# Patient Record
Sex: Female | Born: 1990 | Race: Black or African American | Hispanic: No | Marital: Married | Smoking: Never smoker
Health system: Southern US, Community
[De-identification: ages and names within clinical notes are randomized; demographics above are authoritative.]

## PROBLEM LIST (undated history)

## (undated) ENCOUNTER — Inpatient Hospital Stay (HOSPITAL_COMMUNITY): Payer: Self-pay

## (undated) DIAGNOSIS — B999 Unspecified infectious disease: Secondary | ICD-10-CM

## (undated) DIAGNOSIS — S62109A Fracture of unspecified carpal bone, unspecified wrist, initial encounter for closed fracture: Secondary | ICD-10-CM

## (undated) HISTORY — PX: EYE SURGERY: SHX253

## (undated) HISTORY — DX: Fracture of unspecified carpal bone, unspecified wrist, initial encounter for closed fracture: S62.109A

## (undated) HISTORY — DX: Unspecified infectious disease: B99.9

---

## 2008-08-12 ENCOUNTER — Emergency Department (HOSPITAL_COMMUNITY): Admission: EM | Admit: 2008-08-12 | Discharge: 2008-08-12 | Payer: Self-pay | Admitting: Emergency Medicine

## 2009-03-18 ENCOUNTER — Emergency Department (HOSPITAL_COMMUNITY): Admission: EM | Admit: 2009-03-18 | Discharge: 2009-03-18 | Payer: Self-pay | Admitting: Emergency Medicine

## 2010-05-19 NOTE — L&D Delivery Note (Signed)
Delivery Note At 3:04 PM a viable female was delivered via Vaginal, Spontaneous Delivery (Presentation: Left Occiput Anterior).  APGAR: 9, 9; weight 7 lb 14.6 oz (3589 g).   Placenta status: Intact, Spontaneous.  Cord: 3 vessels with the following complications: None.   Anesthesia:1% lido for repair Episiotomy: none Lacerations: 1st deg perineal Suture Repair: 3.0 vicryl Est. Blood Loss (mL): 300cc  Mom to postpartum.  Baby to nursery-stable.  Cam Hai 04/29/2011, 4:11 PM

## 2010-08-29 LAB — STREP A DNA PROBE: Group A Strep Probe: NEGATIVE

## 2010-08-29 LAB — CBC
MCV: 78.5 fL (ref 78.0–100.0)
Platelets: 185 10*3/uL (ref 150–400)
WBC: 8.9 10*3/uL (ref 4.0–10.5)

## 2010-08-29 LAB — DIFFERENTIAL
Basophils Relative: 0 % (ref 0–1)
Eosinophils Absolute: 0.1 10*3/uL (ref 0.0–0.7)
Lymphs Abs: 1.1 10*3/uL (ref 0.7–4.0)
Neutrophils Relative %: 77 % (ref 43–77)

## 2010-08-29 LAB — MONONUCLEOSIS SCREEN: Mono Screen: NEGATIVE

## 2011-04-04 ENCOUNTER — Institutional Professional Consult (permissible substitution): Payer: Self-pay | Admitting: Pediatrics

## 2011-04-29 ENCOUNTER — Encounter (HOSPITAL_COMMUNITY): Payer: Self-pay | Admitting: *Deleted

## 2011-04-29 ENCOUNTER — Inpatient Hospital Stay (HOSPITAL_COMMUNITY)
Admission: AD | Admit: 2011-04-29 | Discharge: 2011-05-01 | DRG: 373 | Disposition: A | Discharge status: Home / Self Care | Payer: BC Managed Care – PPO | Source: Ambulatory Visit | Attending: Obstetrics & Gynecology | Admitting: Obstetrics & Gynecology

## 2011-04-29 LAB — RUBELLA ANTIBODY, IGM: Rubella: IMMUNE

## 2011-04-29 LAB — STREP B DNA PROBE: GBS: NEGATIVE

## 2011-04-29 LAB — CBC
HCT: 36.3 % (ref 36.0–46.0)
Hemoglobin: 12.1 g/dL (ref 12.0–15.0)
RDW: 14.3 % (ref 11.5–15.5)
WBC: 16.9 10*3/uL — ABNORMAL HIGH (ref 4.0–10.5)

## 2011-04-29 MED ORDER — IBUPROFEN 600 MG PO TABS
600.0000 mg | ORAL_TABLET | Freq: Four times a day (QID) | ORAL | Status: DC
  Administered 2011-04-29 – 2011-05-01 (×6): 600 mg via ORAL
  Filled 2011-04-29 (×8): qty 1

## 2011-04-29 MED ORDER — SIMETHICONE 80 MG PO CHEW: 80.0000 mg | CHEWABLE_TABLET | ORAL | Status: DC | PRN

## 2011-04-29 MED ORDER — LACTATED RINGERS IV SOLN
INTRAVENOUS | Status: DC
  Administered 2011-04-29: 125 mL/h via INTRAUTERINE

## 2011-04-29 MED ORDER — IBUPROFEN 600 MG PO TABS: 600.0000 mg | ORAL_TABLET | Freq: Four times a day (QID) | ORAL | Status: DC | PRN

## 2011-04-29 MED ORDER — ZOLPIDEM TARTRATE 5 MG PO TABS: 5.0000 mg | ORAL_TABLET | Freq: Every evening | ORAL | Status: DC | PRN

## 2011-04-29 MED ORDER — OXYTOCIN BOLUS FROM INFUSION
500.0000 mL | Freq: Once | INTRAVENOUS | Status: DC
  Filled 2011-04-29: qty 500

## 2011-04-29 MED ORDER — TERBUTALINE SULFATE 1 MG/ML IJ SOLN: 0.2500 mg | Freq: Once | INTRAMUSCULAR | Status: DC | PRN

## 2011-04-29 MED ORDER — BENZOCAINE-MENTHOL 20-0.5 % EX AERO
INHALATION_SPRAY | CUTANEOUS | Status: AC
  Filled 2011-04-29: qty 56

## 2011-04-29 MED ORDER — DIPHENHYDRAMINE HCL 25 MG PO CAPS: 25.0000 mg | ORAL_CAPSULE | Freq: Four times a day (QID) | ORAL | Status: DC | PRN

## 2011-04-29 MED ORDER — TETANUS-DIPHTH-ACELL PERTUSSIS 5-2.5-18.5 LF-MCG/0.5 IM SUSP: 0.5000 mL | Freq: Once | INTRAMUSCULAR | Status: DC

## 2011-04-29 MED ORDER — LANOLIN HYDROUS EX OINT: TOPICAL_OINTMENT | CUTANEOUS | Status: DC | PRN

## 2011-04-29 MED ORDER — FLEET ENEMA 7-19 GM/118ML RE ENEM: 1.0000 | ENEMA | RECTAL | Status: DC | PRN

## 2011-04-29 MED ORDER — OXYTOCIN 20 UNITS IN LACTATED RINGERS INFUSION - SIMPLE
125.0000 mL/h | Freq: Once | INTRAVENOUS | Status: AC
  Administered 2011-04-29: 125 mL/h via INTRAVENOUS

## 2011-04-29 MED ORDER — ONDANSETRON HCL 4 MG PO TABS: 4.0000 mg | ORAL_TABLET | ORAL | Status: DC | PRN

## 2011-04-29 MED ORDER — NALBUPHINE SYRINGE 5 MG/0.5 ML
10.0000 mg | INJECTION | INTRAMUSCULAR | Status: DC | PRN
  Administered 2011-04-29: 10 mg via INTRAVENOUS
  Filled 2011-04-29: qty 1

## 2011-04-29 MED ORDER — SENNOSIDES-DOCUSATE SODIUM 8.6-50 MG PO TABS
2.0000 | ORAL_TABLET | Freq: Every day | ORAL | Status: DC
  Administered 2011-04-29: 2 via ORAL
  Administered 2011-04-30: 1 via ORAL

## 2011-04-29 MED ORDER — DIBUCAINE 1 % RE OINT
1.0000 "application " | TOPICAL_OINTMENT | RECTAL | Status: DC | PRN
  Administered 2011-04-29: 1 via RECTAL
  Filled 2011-04-29: qty 28

## 2011-04-29 MED ORDER — OXYTOCIN 20 UNITS IN LACTATED RINGERS INFUSION - SIMPLE
1.0000 m[IU]/min | INTRAVENOUS | Status: DC
  Administered 2011-04-29: 1 m[IU]/min via INTRAVENOUS
  Filled 2011-04-29: qty 1000

## 2011-04-29 MED ORDER — LIDOCAINE HCL (PF) 1 % IJ SOLN
30.0000 mL | INTRAMUSCULAR | Status: DC | PRN
  Administered 2011-04-29: 30 mL via SUBCUTANEOUS
  Filled 2011-04-29: qty 30

## 2011-04-29 MED ORDER — CITRIC ACID-SODIUM CITRATE 334-500 MG/5ML PO SOLN: 30.0000 mL | ORAL | Status: DC | PRN

## 2011-04-29 MED ORDER — ONDANSETRON HCL 4 MG/2ML IJ SOLN: 4.0000 mg | Freq: Four times a day (QID) | INTRAMUSCULAR | Status: DC | PRN

## 2011-04-29 MED ORDER — WITCH HAZEL-GLYCERIN EX PADS: 1.0000 "application " | MEDICATED_PAD | CUTANEOUS | Status: DC | PRN

## 2011-04-29 MED ORDER — ACETAMINOPHEN 325 MG PO TABS: 650.0000 mg | ORAL_TABLET | ORAL | Status: DC | PRN

## 2011-04-29 MED ORDER — ONDANSETRON HCL 4 MG/2ML IJ SOLN: 4.0000 mg | INTRAMUSCULAR | Status: DC | PRN

## 2011-04-29 MED ORDER — LACTATED RINGERS IV SOLN
INTRAVENOUS | Status: DC
  Administered 2011-04-29: 125 mL/h via INTRAVENOUS

## 2011-04-29 MED ORDER — OXYCODONE-ACETAMINOPHEN 5-325 MG PO TABS: 1.0000 | ORAL_TABLET | ORAL | Status: DC | PRN

## 2011-04-29 MED ORDER — PRENATAL PLUS 27-1 MG PO TABS
1.0000 | ORAL_TABLET | Freq: Every day | ORAL | Status: DC
  Administered 2011-04-29 – 2011-05-01 (×3): 1 via ORAL
  Filled 2011-04-29 (×3): qty 1

## 2011-04-29 MED ORDER — LACTATED RINGERS IV SOLN: 500.0000 mL | INTRAVENOUS | Status: DC | PRN

## 2011-04-29 MED ORDER — BENZOCAINE-MENTHOL 20-0.5 % EX AERO: 1.0000 "application " | INHALATION_SPRAY | CUTANEOUS | Status: DC | PRN

## 2011-04-29 MED ORDER — OXYCODONE-ACETAMINOPHEN 5-325 MG PO TABS: 2.0000 | ORAL_TABLET | ORAL | Status: DC | PRN

## 2011-04-29 NOTE — Progress Notes (Signed)
Sabrina Sanchez is a 20 y.o. G1P0000 at [redacted]w[redacted]d Subjective: Feeling pressure and urge to push; requesting exam  Objective: BP 122/91  Pulse 119  Temp(Src) 98.5 F (36.9 C) (Oral)  Resp 20  SpO2 94%      FHT:  140, mod variability, some 10x10 accels, occ mi variables UC:   regular, every 3 minutes; MVUs adequate SVE:   Dilation: 10 Effacement (%): 100 Station: +2 Exam by:: Shamikia Linskey,CNM  Labs: Lab Results  Component Value Date   WBC 16.9* 04/29/2011   HGB 12.1 04/29/2011   HCT 36.3 04/29/2011   MCV 78.1 04/29/2011   PLT 181 04/29/2011    Assessment / Plan: Augmentation of labor, progressing well End 1st stage  Will begin pushing with the urge  Dagny Fiorentino 04/29/2011, 2:34 PM

## 2011-04-29 NOTE — H&P (Signed)
Sabrina Sanchez is a 20 y.o. female presenting for eval of protracted active phase of labor; has been cared for thus far prenatally and during labor by Clois Dupes CNM as pt was planning a home birth. Her prenatal records were reviewed in full. Care was shown to be initiated between 4-5 wks with certain LMP of 07/09/10 and regular cycles. Gestation was confirmed by 18 wk scan, GBS was neg, and rest of prenatal course was essentially unremarkable. She was in latent phase until 0650 this morning when her cx was found to be 6cm. AT 1030 she was 7cm and SROM with thick mec was noted and it was determined that she would transfer to [facility] for further eval of protracted active phase and MSF. History OB History    Grav Para Term Preterm Abortions TAB SAB Ect Mult Living   1 0 0 0 0 0 0 0 0 0      History reviewed. No pertinent past medical history. No past surgical history on file. Family History: family history is negative for Anesthesia problems. Social History:  reports that she has never smoked. She has never used smokeless tobacco. She reports that she does not drink alcohol or use illicit drugs.  Review of Systems  Constitutional: Negative for fever.  Gastrointestinal: Positive for diarrhea. Abdominal pain: used castor oil this AM.    Dilation: 7 Exam by:: Heidy Mccubbin,CNM Blood pressure 113/61, pulse 126, temperature 98.5 F (36.9 C), temperature source Oral, resp. rate 20, SpO2 100.00%. Maternal Exam:  Uterine Assessment: IUPC inserted upon arrival; MVUs approx 140 with ctx q 4 mins; mod MSF noted      Fetal Exam Fetal Monitor Review: Baseline rate: 140.  Variability: minimal (<5 bpm).   Pattern: variable decelerations.   Some 10x10 accels, but no true ones- some reactivity noted with exam; variables noted and at one time was to 80 but resolved with hands/knees, O2 and fluid bolus     Physical Exam  Constitutional: She is oriented to person, place, and time. She appears well-developed and  well-nourished.  HENT:  Head: Normocephalic.  Cardiovascular: Normal rate.   Respiratory: Effort normal.  Musculoskeletal: Normal range of motion.  Neurological: She is alert and oriented to person, place, and time.  Skin: Skin is warm and dry.  Psychiatric: She has a normal mood and affect. Her behavior is normal.    Prenatal labs: ABO, Rh: B/Positive/-- (12/11 0000) Antibody: Negative (12/11 0000) Rubella: Immune (12/11 0000) RPR:   NR HBsAg:   neg HIV: Non-reactive (12/11 0000)  GBS: Negative (12/11 0000)  Glucola: 97 Assessment/Plan: IUP at term Planning homebirth Protracted active labor MSF  Admit to [employer]- received report from her CNM Will monitor FHR closely- amnioinfusion started Nubain for pain Pitocin due to inadequate ctx Pt/spouse/CNM understand immediate plan of care    Cam Hai 04/29/2011, 12:24 PM

## 2011-04-29 NOTE — Progress Notes (Addendum)
Pt in wheelchair for transfer to room 104, pt passed out, pt back to bed, IV started to left forearm, LR bolus infusing, pt states is feeling "much better", bleeding- WNL, I/o cath done- 600 out. K.Clelia Croft, CNM notified

## 2011-04-30 LAB — RPR: RPR Ser Ql: NONREACTIVE

## 2011-04-30 NOTE — Progress Notes (Signed)
Post Partum Day #1 Subjective: no complaints, up ad lib and voiding; breastfeeding going slowly Objective: Blood pressure 100/63, pulse 100, temperature 98.5 F (36.9 C), temperature source Oral, resp. rate 18, height 5\' 7"  (1.702 m), weight 69.854 kg (154 lb), SpO2 99.00%, unknown if currently breastfeeding.  Physical Exam:  General: alert, cooperative and no distress Lochia: appropriate Uterine Fundus: firm   Basename 04/29/11 1230  HGB 12.1  HCT 36.3    Assessment/Plan: Plan for discharge tomorrow and Lactation consult   LOS: 1 day   Cam Hai 04/30/2011, 7:46 AM

## 2011-05-01 MED ORDER — BENZOCAINE-MENTHOL 20-0.5 % EX AERO
INHALATION_SPRAY | CUTANEOUS | Status: AC
  Filled 2011-05-01: qty 56

## 2011-05-01 MED ORDER — IBUPROFEN 600 MG PO TABS: 600.0000 mg | ORAL_TABLET | Freq: Four times a day (QID) | ORAL | Status: AC | PRN

## 2011-05-01 NOTE — Progress Notes (Signed)
Post Partum Day 2  no complaints  Objective: Blood pressure 103/66, pulse 86, temperature 98.3 F (36.8 C), temperature source Oral, resp. rate 18, height 5\' 7"  (1.702 m), weight 69.854 kg (154 lb), SpO2 97.00%, unknown if currently breastfeeding.  Physical Exam:  General: alert, cooperative and no distress Lochia: appropriate Uterine Fundus: firm Incision: n/a DVT Evaluation: No evidence of DVT seen on physical exam.   Basename 04/29/11 1230  HGB 12.1  HCT 36.3    Assessment/Plan: Discharge home, Breastfeeding and Contraception LAM    LOS: 2 days   Lukah Goswami E. 05/01/2011, 6:51 AM

## 2011-05-01 NOTE — Discharge Summary (Signed)
Obstetric Discharge Summary Reason for Admission: onset of labor, planned homebirth, presented at 7cm, protracted active phase, MSF Prenatal Procedures: NST and ultrasound Intrapartum Procedures: spontaneous vaginal delivery Postpartum Procedures: none Complications-Operative and Postpartum: 1st degree perineal laceration Hemoglobin  Date Value Range Status  04/29/2011 12.1  12.0-15.0 (g/dL) Final     HCT  Date Value Range Status  04/29/2011 36.3  36.0-46.0 (%) Final    Discharge Diagnoses: Term Pregnancy-delivered and Breastfeeding  Discharge Information: Date: 05/01/2011 Activity: pelvic rest Diet: routine Medications: Ibuprofen Condition: stable Instructions: refer to practice specific booklet Discharge to: home Follow-up Information    Please follow up. Sabrina Sanchez, CNM as scheduled)          Newborn Data: Live born female  Birth Weight: 7 lb 14.6 oz (3589 g) APGAR: 9, 9  Home with mother.  Sabrina Sanchez E. 05/01/2011, 6:53 AM

## 2011-05-02 ENCOUNTER — Ambulatory Visit (HOSPITAL_COMMUNITY)
Admission: RE | Admit: 2011-05-02 | Discharge: 2011-05-02 | Disposition: A | Discharge status: Home / Self Care | Payer: BC Managed Care – PPO | Source: Ambulatory Visit | Attending: Obstetrics & Gynecology | Admitting: Obstetrics & Gynecology

## 2011-05-02 NOTE — Progress Notes (Signed)
Adult Lactation Consultation Outpatient Visit Note  Patient Name: Sabrina Sanchez    BABY:  CHASITY Mariani Date of Birth: 30-Jun-1990            DOB:  04/29/2013 Gestational Age at Delivery: [redacted]w[redacted]d     BIRTH WEIGHT: 7-10 Type of Delivery: NVD                          WEIGHT TODAY:  7-2.6  Breastfeeding History: Frequency of Breastfeeding: ATTEMPTS EVERY 2-3 HOURS Length of Feeding: NONE TO 20 MIN Voids: 1/24 HRS Stools: 1/24 HRS  Supplementing / Method:FORMULA 50 MLS JUST PRIOR TO OUTPATIENT VISIT Pumping:  Type of Pump:DEBP   Frequency:PRN  Volume:  FEW MLS  Comments:    Consultation Evaluation:BABY IS 2 DAYS OLD AND MOTHER HERE WITH C/O DIFFICULT LATCH, CRACKED NIPPLES.  BABY WAS JUST FED 50 MLS OF FORMULA SO CONTENT AND SHOWING NO FEEDING CUES.  ASSISTED WITH CORRECT TECHNIQUES FOR POSITIONING AND LATCH BUT UNABLE TO DO A FEEDING ASSESSMENT. BOTH NIPPLES ARE CRACKED.   PATIENT GIVEN SORE NIPPLE SHELLS AND COMFORT GELS WITH INSTRUCTIONS.   BREASTS ARE BECOMING FULL AND ENGORGEMENT TREATMENT REVIEWED.  DEMONSTRATED USE OF 24 MM NIPPLE SHIELD FOR PRN USE.  BABY HAS WEIGHT CHECK WITH PEDI. FOLLOWING THIS APPOINTMENT TODAY.  INSTRUCTED PATIENT TO PUMP BOTH BREASTS EVERY 2-3 HOURS AND FEED WITH SLOW FLOW NIPPLE IF BABY NOT LATCHING.  ENCOURAGED TO CALL FOR FEEDING ASSESSMENT IF NO IMPROVEMENT.  Initial Feeding Assessment: Pre-feed Weight: Post-feed Weight: Amount Transferred: Comments:  Additional Feeding Assessment: Pre-feed Weight: Post-feed Weight: Amount Transferred: Comments:  Additional Feeding Assessment: Pre-feed Weight: Post-feed Weight: Amount Transferred: Comments:  Total Breast milk Transferred this Visit:  Total Supplement Given:   Additional Interventions:   Follow-Up WILL CALL PRN      Hansel Feinstein 05/02/2011, 3:37 PM

## 2011-08-06 ENCOUNTER — Ambulatory Visit (HOSPITAL_COMMUNITY): Admission: RE | Admit: 2011-08-06 | Payer: BC Managed Care – PPO | Source: Ambulatory Visit

## 2012-05-19 NOTE — L&D Delivery Note (Signed)
Delivery Note At 5:48 AM a viable female, "Sabrina Sanchez", was delivered in waterbirth tub via Vaginal, Spontaneous Delivery (Presentation: ;  ).  APGAR: 9, 9; weight 7 lb 13.6 oz (3561 g).   Placenta status: Intact, Spontaneous.  Cord: 3 vessels with the following complications: Cord around shoulders x 1.  Cord pH: NA  Anesthesia: Local  Episiotomy: None Lacerations: 2nd degree Suture Repair: 3.0 monocryl Est. Blood Loss (mL): 400 cc  Mom to postpartum.  Baby to skin to skin.   Nigel Bridgeman 03/02/2013, 7:18 AM

## 2012-07-02 LAB — OB RESULTS CONSOLE GC/CHLAMYDIA
Chlamydia: NEGATIVE
Gonorrhea: NEGATIVE

## 2012-07-09 ENCOUNTER — Ambulatory Visit: Payer: BC Managed Care – PPO | Admitting: Obstetrics and Gynecology

## 2012-07-09 ENCOUNTER — Encounter: Payer: Self-pay | Admitting: Obstetrics and Gynecology

## 2012-07-09 DIAGNOSIS — O26849 Uterine size-date discrepancy, unspecified trimester: Secondary | ICD-10-CM

## 2012-07-09 DIAGNOSIS — Z331 Pregnant state, incidental: Secondary | ICD-10-CM

## 2012-07-09 LAB — POCT URINALYSIS DIPSTICK
Bilirubin, UA: NEGATIVE
Ketones, UA: NEGATIVE
Leukocytes, UA: NEGATIVE
pH, UA: 7

## 2012-07-09 NOTE — Progress Notes (Signed)
NOB INTERVIEW. Pt states LMP did not seem normal. Dating U/S scheduled per protocol. NOB W/U with VL 08/06/12.

## 2012-07-10 LAB — CULTURE, OB URINE: Colony Count: 15000

## 2012-07-12 ENCOUNTER — Telehealth: Payer: Self-pay | Admitting: Obstetrics and Gynecology

## 2012-07-12 LAB — PRENATAL PANEL VII
Basophils Absolute: 0 10*3/uL (ref 0.0–0.1)
Basophils Relative: 0 % (ref 0–1)
HIV: NONREACTIVE
Hepatitis B Surface Ag: NEGATIVE
MCHC: 32.9 g/dL (ref 30.0–36.0)
Neutro Abs: 4.9 10*3/uL (ref 1.7–7.7)
Neutrophils Relative %: 69 % (ref 43–77)
RDW: 15.8 % — ABNORMAL HIGH (ref 11.5–15.5)

## 2012-07-12 NOTE — Telephone Encounter (Signed)
Encounter complete. 

## 2012-07-13 LAB — HEMOGLOBINOPATHY EVALUATION
Hemoglobin Other: 0 %
Hgb A2 Quant: 2.7 % (ref 2.2–3.2)
Hgb F Quant: 0 % (ref 0.0–2.0)

## 2012-07-21 ENCOUNTER — Ambulatory Visit: Payer: BC Managed Care – PPO

## 2012-07-21 ENCOUNTER — Ambulatory Visit: Payer: BC Managed Care – PPO | Admitting: Certified Nurse Midwife

## 2012-07-21 ENCOUNTER — Other Ambulatory Visit: Payer: Self-pay

## 2012-07-21 ENCOUNTER — Encounter: Payer: Self-pay | Admitting: Certified Nurse Midwife

## 2012-07-21 VITALS — BP 100/58 | Wt 158.0 lb

## 2012-07-21 DIAGNOSIS — O26849 Uterine size-date discrepancy, unspecified trimester: Secondary | ICD-10-CM

## 2012-07-21 DIAGNOSIS — Z331 Pregnant state, incidental: Secondary | ICD-10-CM

## 2012-07-21 LAB — US OB COMP LESS 14 WKS

## 2012-07-21 NOTE — Progress Notes (Signed)
[redacted]w[redacted]d Go over U/S

## 2012-08-06 ENCOUNTER — Encounter: Payer: BC Managed Care – PPO | Admitting: Obstetrics and Gynecology

## 2012-08-06 ENCOUNTER — Other Ambulatory Visit: Payer: Self-pay | Admitting: Obstetrics and Gynecology

## 2012-08-07 LAB — CULTURE, OB URINE

## 2012-08-08 NOTE — Progress Notes (Signed)
Doing well per pt. No vertigo FHT's regular US-viable fetus EDD 03-05-2013 Needs OB WU Blood type B pos Re check CBC next visit Increase fluids and balanced diet

## 2012-08-09 LAB — PAP IG W/ RFLX HPV ASCU

## 2013-03-02 ENCOUNTER — Inpatient Hospital Stay (HOSPITAL_COMMUNITY)
Admission: AD | Admit: 2013-03-02 | Discharge: 2013-03-03 | DRG: 775 | Disposition: A | Discharge status: Home / Self Care | Payer: Medicaid Other | Source: Ambulatory Visit | Attending: Obstetrics and Gynecology | Admitting: Obstetrics and Gynecology

## 2013-03-02 ENCOUNTER — Encounter (HOSPITAL_COMMUNITY): Payer: Self-pay | Admitting: *Deleted

## 2013-03-02 DIAGNOSIS — O9902 Anemia complicating childbirth: Secondary | ICD-10-CM | POA: Diagnosis present

## 2013-03-02 DIAGNOSIS — D649 Anemia, unspecified: Secondary | ICD-10-CM | POA: Diagnosis present

## 2013-03-02 DIAGNOSIS — Z22330 Carrier of Group B streptococcus: Secondary | ICD-10-CM

## 2013-03-02 DIAGNOSIS — O99892 Other specified diseases and conditions complicating childbirth: Secondary | ICD-10-CM | POA: Diagnosis present

## 2013-03-02 LAB — CBC
HCT: 36.3 % (ref 36.0–46.0)
MCV: 74.8 fL — ABNORMAL LOW (ref 78.0–100.0)
Platelets: 176 10*3/uL (ref 150–400)
RBC: 4.85 MIL/uL (ref 3.87–5.11)
WBC: 11.6 10*3/uL — ABNORMAL HIGH (ref 4.0–10.5)

## 2013-03-02 LAB — RPR: RPR Ser Ql: NONREACTIVE

## 2013-03-02 MED ORDER — OXYCODONE-ACETAMINOPHEN 5-325 MG PO TABS: 1.0000 | ORAL_TABLET | ORAL | Status: DC | PRN

## 2013-03-02 MED ORDER — SODIUM CHLORIDE 0.9 % IV SOLN: 250.0000 mL | INTRAVENOUS | Status: DC | PRN

## 2013-03-02 MED ORDER — TETANUS-DIPHTH-ACELL PERTUSSIS 5-2.5-18.5 LF-MCG/0.5 IM SUSP: 0.5000 mL | Freq: Once | INTRAMUSCULAR | Status: DC

## 2013-03-02 MED ORDER — LACTATED RINGERS IV SOLN: 500.0000 mL | INTRAVENOUS | Status: DC | PRN

## 2013-03-02 MED ORDER — SIMETHICONE 80 MG PO CHEW: 80.0000 mg | CHEWABLE_TABLET | ORAL | Status: DC | PRN

## 2013-03-02 MED ORDER — SODIUM CHLORIDE 0.9 % IV SOLN
2.0000 g | Freq: Once | INTRAVENOUS | Status: AC
  Administered 2013-03-02: 2 g via INTRAVENOUS
  Filled 2013-03-02: qty 2000

## 2013-03-02 MED ORDER — ONDANSETRON HCL 4 MG/2ML IJ SOLN: 4.0000 mg | INTRAMUSCULAR | Status: DC | PRN

## 2013-03-02 MED ORDER — FLEET ENEMA 7-19 GM/118ML RE ENEM: 1.0000 | ENEMA | RECTAL | Status: DC | PRN

## 2013-03-02 MED ORDER — SODIUM CHLORIDE 0.9 % IJ SOLN: 3.0000 mL | INTRAMUSCULAR | Status: DC | PRN

## 2013-03-02 MED ORDER — PRENATAL MULTIVITAMIN CH
1.0000 | ORAL_TABLET | Freq: Every day | ORAL | Status: DC
  Administered 2013-03-02 – 2013-03-03 (×2): 1 via ORAL
  Filled 2013-03-02 (×2): qty 1

## 2013-03-02 MED ORDER — WITCH HAZEL-GLYCERIN EX PADS: 1.0000 "application " | MEDICATED_PAD | CUTANEOUS | Status: DC | PRN

## 2013-03-02 MED ORDER — LANOLIN HYDROUS EX OINT: TOPICAL_OINTMENT | CUTANEOUS | Status: DC | PRN

## 2013-03-02 MED ORDER — ONDANSETRON HCL 4 MG/2ML IJ SOLN: 4.0000 mg | Freq: Four times a day (QID) | INTRAMUSCULAR | Status: DC | PRN

## 2013-03-02 MED ORDER — LIDOCAINE HCL (PF) 1 % IJ SOLN
30.0000 mL | INTRAMUSCULAR | Status: DC | PRN
  Filled 2013-03-02: qty 30

## 2013-03-02 MED ORDER — DIPHENHYDRAMINE HCL 25 MG PO CAPS: 25.0000 mg | ORAL_CAPSULE | Freq: Four times a day (QID) | ORAL | Status: DC | PRN

## 2013-03-02 MED ORDER — SODIUM CHLORIDE 0.9 % IJ SOLN: 3.0000 mL | Freq: Two times a day (BID) | INTRAMUSCULAR | Status: DC

## 2013-03-02 MED ORDER — CITRIC ACID-SODIUM CITRATE 334-500 MG/5ML PO SOLN: 30.0000 mL | ORAL | Status: DC | PRN

## 2013-03-02 MED ORDER — ZOLPIDEM TARTRATE 5 MG PO TABS: 5.0000 mg | ORAL_TABLET | Freq: Every evening | ORAL | Status: DC | PRN

## 2013-03-02 MED ORDER — LACTATED RINGERS IV SOLN
INTRAVENOUS | Status: DC
  Administered 2013-03-02: 02:00:00 via INTRAVENOUS

## 2013-03-02 MED ORDER — ONDANSETRON HCL 4 MG PO TABS: 4.0000 mg | ORAL_TABLET | ORAL | Status: DC | PRN

## 2013-03-02 MED ORDER — ACETAMINOPHEN 325 MG PO TABS: 650.0000 mg | ORAL_TABLET | ORAL | Status: DC | PRN

## 2013-03-02 MED ORDER — BENZOCAINE-MENTHOL 20-0.5 % EX AERO
1.0000 "application " | INHALATION_SPRAY | CUTANEOUS | Status: DC | PRN
  Administered 2013-03-02: 1 via TOPICAL
  Filled 2013-03-02 (×3): qty 56

## 2013-03-02 MED ORDER — IBUPROFEN 600 MG PO TABS
600.0000 mg | ORAL_TABLET | Freq: Four times a day (QID) | ORAL | Status: DC
  Administered 2013-03-02 – 2013-03-03 (×5): 600 mg via ORAL
  Filled 2013-03-02 (×5): qty 1

## 2013-03-02 MED ORDER — DIBUCAINE 1 % RE OINT
1.0000 "application " | TOPICAL_OINTMENT | RECTAL | Status: DC | PRN
  Filled 2013-03-02: qty 28

## 2013-03-02 MED ORDER — BUTORPHANOL TARTRATE 1 MG/ML IJ SOLN: 1.0000 mg | INTRAMUSCULAR | Status: DC | PRN

## 2013-03-02 MED ORDER — SENNOSIDES-DOCUSATE SODIUM 8.6-50 MG PO TABS
2.0000 | ORAL_TABLET | ORAL | Status: DC
  Administered 2013-03-02: 2 via ORAL
  Filled 2013-03-02: qty 2

## 2013-03-02 MED ORDER — IBUPROFEN 600 MG PO TABS
600.0000 mg | ORAL_TABLET | Freq: Four times a day (QID) | ORAL | Status: DC | PRN
  Administered 2013-03-02: 600 mg via ORAL
  Filled 2013-03-02: qty 1

## 2013-03-02 MED ORDER — OXYTOCIN BOLUS FROM INFUSION: 500.0000 mL | INTRAVENOUS | Status: DC

## 2013-03-02 MED ORDER — OXYTOCIN 40 UNITS IN LACTATED RINGERS INFUSION - SIMPLE MED
62.5000 mL/h | INTRAVENOUS | Status: DC
  Administered 2013-03-02: 62.5 mL/h via INTRAVENOUS
  Filled 2013-03-02: qty 1000

## 2013-03-02 NOTE — MAU Note (Signed)
PT SAYS SHE THINKS SROM AT 1140PM-  YELLOW-.     HURT BAD AT    UNSURE.   NO VE  IN OFFICE- WAS SUPPOSE  TO GO TO OFFICE YESTERDAY- BUT SHE CANCELLED.    GBS- POSITIVE- PER PT.    DENIES HSV AND MRSA.

## 2013-03-02 NOTE — Progress Notes (Signed)
Manfred Arch, CNM in room per RN request secondary pt bearing down & pushing with ctxs

## 2013-03-02 NOTE — H&P (Signed)
  Sabrina Sanchez is a 22 y.o. female, G2P1001 at 59 4/7 weeks, presenting with SROM at 11:45pm, with light MSF noted, and UCs q 3-4 min.  Reports +FM, denies bleeding.  Plans waterbirth, has doula Shanda Bumps Pace)--she is coming to hospital.  Problem List: GBS positive Insufficient glandular tissue--difficulty with lactation pp after last delivery. Mild anemia  History of present pregnancy: Patient entered care at 10 3/7 weeks.   EDC of 03/06/13 was established by LMP and 1st trimester [place].   Anatomy scan:  21 6/7 weeks, with normal findings and an posterior placenta.   Limited cardiac anatomy.  Additional [place] evaluations:  24 6/7 weeks, to f/u cardiac anatomy--all WNL Significant prenatal events:  Progesterone level checked during pregnancy, declined supplementation (initially had desired to supplement due to hx of insufficient glandular tissue) Last evaluation:  02/21/13, no VE done.  OB History   Grav Para Term Preterm Abortions TAB SAB Ect Mult Living   2 1 1  0 0 0 0 0 0 1    2012--41 6/7 weeks, attempted home birth, long labor--transported to [facility], started pit aug, delivered with Chiropractor.  7+14, no complications.  Past Medical History  Diagnosis Date  . Infection     UTI X 1  . Infection     OCC YEAST  . Fracture of wrist AGE 37 AND AGE 57   Past Surgical History  Procedure Laterality Date  . Eye surgery  AGE 6   Family History: family history includes Asthma in her maternal aunt; Hyperlipidemia in her maternal grandmother; Thrombophlebitis in her maternal grandmother. There is no history of Anesthesia problems.  Social History:  reports that she has never smoked. She has never used smokeless tobacco. She reports that she does not drink alcohol or use illicit drugs.  Husband, Artis Buechele, is involved and supportive.  Patient currently unemployed.   Prenatal Transfer Tool  Maternal Diabetes: No Genetic Screening: Normal 1st trimester screen Maternal Ultrasounds/Referrals:  Normal Fetal Ultrasounds or other Referrals:  None Maternal Substance Abuse:  No Significant Maternal Medications:  None Significant Maternal Lab Results: Lab values include: Group B Strep positive Planning waterbirth.    ROS:  Contractions, leaking yellow fluid, +FM.  No Known Allergies     Blood pressure 130/86, pulse 98, temperature 98.3 F (36.8 C), temperature source Oral, resp. rate 20, height 5\' 6"  (1.676 m), weight 197 lb 6 oz (89.529 kg), last menstrual period 05/29/2012.  Chest clear Heart RRR without murmur Abd gravid, NT, FH 39 cm Pelvic: 5-6, 80%, vtx, -1, leaking light MSF Ext: WNL  FHR: Category 1 on initial tracing UCs:  q 3-4 min, moderate.  Prenatal labs: ABO, Rh: B/POS/-- (02/21 0941) Antibody: NEG (02/21 0941) Rubella:   Immune RPR: NON REAC (02/21 0941)  HBsAg: NEGATIVE (02/21 0941)  HIV: NON REACTIVE (02/21 0941)  GBS:  Positive Sickle cell/Hgb electrophoresis: Negative Pap:  07/2012 GC:  Negative at NOB visit Chlamydia:  Negative at NOB visit Genetic screenings: Normal 1st trimester screen Glucola:  Normal       Assessment/Plan: IUP at 39 4/7 weeks Active labor GBS positive Plans waterbirth  Plan: Admit to Birthing Suite per consult with Dr. Su Hilt Routine [facility] orders Ampicillin for GBS prophylaxis, due to active labor OK for waterbirth--consent on chart. Intermittent monitoring.  Malyssa Maris, VICKICNM, [place] 03/02/2013, 1:39 AM

## 2013-03-03 LAB — CBC
HCT: 25 % — ABNORMAL LOW (ref 36.0–46.0)
MCV: 76.5 fL — ABNORMAL LOW (ref 78.0–100.0)
Platelets: 130 10*3/uL — ABNORMAL LOW (ref 150–400)
RBC: 3.27 MIL/uL — ABNORMAL LOW (ref 3.87–5.11)
RDW: 15.8 % — ABNORMAL HIGH (ref 11.5–15.5)
WBC: 9.9 10*3/uL (ref 4.0–10.5)

## 2013-03-03 MED ORDER — OXYCODONE-ACETAMINOPHEN 5-325 MG PO TABS: 1.0000 | ORAL_TABLET | Freq: Four times a day (QID) | ORAL | Status: DC | PRN

## 2013-03-03 MED ORDER — IBUPROFEN 600 MG PO TABS: 600.0000 mg | ORAL_TABLET | Freq: Four times a day (QID) | ORAL | Status: DC | PRN

## 2013-03-03 NOTE — Discharge Summary (Signed)
Obstetric Discharge Summary Reason for Admission: onset of labor Prenatal Procedures: ultrasound Intrapartum Procedures: spontaneous vaginal delivery Postpartum Procedures: none Complications-Operative and Postpartum: none Hemoglobin  Date Value Range Status  03/03/2013 8.1* 12.0 - 15.0 g/dL Final     DELTA CHECK NOTED     REPEATED TO VERIFY     HCT  Date Value Range Status  03/03/2013 25.0* 36.0 - 46.0 % Final   Pt felt a little dizzy yesterday but no complaints today but has only ambulated to bathroom.  I rec patient walk up and down halls and if is asymptomatic then she may be discharged home.  The RN will let me know if she has any symptoms.  Breast feeding.  Undecided about BC.  Physical Exam:  General: alert and no distress Lochia: appropriate Uterine Fundus: firm DVT Evaluation: No evidence of DVT seen on physical exam.  Discharge Diagnoses: Term Pregnancy-delivered  Discharge Information: Date: 03/03/2013 Activity: pelvic rest Diet: routine Medications: Ibuprofen and Percocet Condition: stable Instructions: refer to practice specific booklet Discharge to: home Follow-up Information   Follow up with [facility] In 6 weeks. (for post partum visit)    Specialty:  Obstetrics and Gynecology   Contact information:   [address] [place] [address] [phone]      Newborn Data: Live born female  Birth Weight: 7 lb 13.6 oz (3561 g) APGAR: 9, 9  Home with mother.  Annah Jasko Y 03/03/2013, 1:24 PM

## 2013-03-24 ENCOUNTER — Other Ambulatory Visit: Payer: Self-pay

## 2014-03-20 ENCOUNTER — Encounter (HOSPITAL_COMMUNITY): Payer: Self-pay | Admitting: *Deleted

## 2014-05-01 LAB — OB RESULTS CONSOLE ABO/RH: RH TYPE: POSITIVE

## 2014-05-01 LAB — OB RESULTS CONSOLE HEPATITIS B SURFACE ANTIGEN: HEP B S AG: NEGATIVE

## 2014-05-01 LAB — OB RESULTS CONSOLE HIV ANTIBODY (ROUTINE TESTING): HIV: NONREACTIVE

## 2014-05-01 LAB — OB RESULTS CONSOLE RUBELLA ANTIBODY, IGM: RUBELLA: IMMUNE

## 2014-05-01 LAB — OB RESULTS CONSOLE RPR: RPR: NONREACTIVE

## 2014-05-19 NOTE — L&D Delivery Note (Signed)
Delivery Note At 6:54 AM a viable and healthy female "as yet unnamed" was delivered via [place] (Presentation:OA). APGARS: 7, 9; weight 8 lbs 4.5 oz (3756g).    Placenta status: Intact, Spontaneous, marginal insertion.  Cord: 3 vessels with the following complications: Tight nuchal x 1, infant somersaulted through. Cord pH: NA  Anesthesia: Local for repair Episiotomy: None Lacerations: 2nd degree perineal   Suture Repair: 3.0 vicryl CT-1  Repair by Venus Standard, CNM Est. Blood Loss (mL): 585   10 units Pitocin given IM after delivery of placenta.  Mom to postpartum.  Baby to Couplet care / Skin to Skin.  Mom plans to breastfeed.  Desires outpatient circumcision.  Birth control not discussed.  Sherre Scarlet 11/12/2014, 8:00 AM

## 2014-06-01 LAB — OB RESULTS CONSOLE GC/CHLAMYDIA
CHLAMYDIA, DNA PROBE: NEGATIVE
Gonorrhea: NEGATIVE

## 2014-10-24 LAB — OB RESULTS CONSOLE GBS: STREP GROUP B AG: NEGATIVE

## 2014-11-11 ENCOUNTER — Inpatient Hospital Stay (HOSPITAL_COMMUNITY)
Admission: AD | Admit: 2014-11-11 | Discharge: 2014-11-13 | DRG: 775 | Disposition: A | Discharge status: Home / Self Care | Payer: BLUE CROSS/BLUE SHIELD | Source: Ambulatory Visit | Attending: Obstetrics and Gynecology | Admitting: Obstetrics and Gynecology

## 2014-11-11 ENCOUNTER — Encounter (HOSPITAL_COMMUNITY): Payer: Self-pay | Admitting: *Deleted

## 2014-11-11 ENCOUNTER — Inpatient Hospital Stay (HOSPITAL_COMMUNITY)
Admission: AD | Admit: 2014-11-11 | Discharge: 2014-11-11 | Disposition: A | Discharge status: Home / Self Care | Payer: BLUE CROSS/BLUE SHIELD | Source: Ambulatory Visit | Attending: Obstetrics and Gynecology | Admitting: Obstetrics and Gynecology

## 2014-11-11 ENCOUNTER — Encounter (HOSPITAL_COMMUNITY): Payer: Self-pay

## 2014-11-11 DIAGNOSIS — O9902 Anemia complicating childbirth: Secondary | ICD-10-CM | POA: Diagnosis present

## 2014-11-11 DIAGNOSIS — O43123 Velamentous insertion of umbilical cord, third trimester: Secondary | ICD-10-CM | POA: Diagnosis present

## 2014-11-11 DIAGNOSIS — Z6830 Body mass index (BMI) 30.0-30.9, adult: Secondary | ICD-10-CM

## 2014-11-11 DIAGNOSIS — Z3A39 39 weeks gestation of pregnancy: Secondary | ICD-10-CM

## 2014-11-11 DIAGNOSIS — IMO0002 Reserved for concepts with insufficient information to code with codable children: Secondary | ICD-10-CM

## 2014-11-11 MED ORDER — ACETAMINOPHEN 325 MG PO TABS: 650.0000 mg | ORAL_TABLET | ORAL | Status: DC | PRN

## 2014-11-11 MED ORDER — ONDANSETRON HCL 4 MG/2ML IJ SOLN: 4.0000 mg | Freq: Four times a day (QID) | INTRAMUSCULAR | Status: DC | PRN

## 2014-11-11 MED ORDER — LACTATED RINGERS IV SOLN: 500.0000 mL | INTRAVENOUS | Status: DC | PRN

## 2014-11-11 MED ORDER — OXYCODONE-ACETAMINOPHEN 5-325 MG PO TABS: 1.0000 | ORAL_TABLET | ORAL | Status: DC | PRN

## 2014-11-11 MED ORDER — OXYTOCIN 40 UNITS IN LACTATED RINGERS INFUSION - SIMPLE MED: 62.5000 mL/h | INTRAVENOUS | Status: DC

## 2014-11-11 MED ORDER — NALBUPHINE HCL 10 MG/ML IJ SOLN: 10.0000 mg | INTRAMUSCULAR | Status: DC | PRN

## 2014-11-11 MED ORDER — FLEET ENEMA 7-19 GM/118ML RE ENEM: 1.0000 | ENEMA | RECTAL | Status: DC | PRN

## 2014-11-11 MED ORDER — LIDOCAINE HCL (PF) 1 % IJ SOLN
30.0000 mL | INTRAMUSCULAR | Status: DC | PRN
  Administered 2014-11-12: 30 mL via SUBCUTANEOUS
  Filled 2014-11-11: qty 30

## 2014-11-11 MED ORDER — OXYTOCIN BOLUS FROM INFUSION: 500.0000 mL | INTRAVENOUS | Status: DC

## 2014-11-11 MED ORDER — SODIUM CHLORIDE 0.9 % IV SOLN: 250.0000 mL | INTRAVENOUS | Status: DC | PRN

## 2014-11-11 MED ORDER — OXYCODONE-ACETAMINOPHEN 5-325 MG PO TABS: 2.0000 | ORAL_TABLET | ORAL | Status: DC | PRN

## 2014-11-11 MED ORDER — CITRIC ACID-SODIUM CITRATE 334-500 MG/5ML PO SOLN: 30.0000 mL | ORAL | Status: DC | PRN

## 2014-11-11 NOTE — H&P (Signed)
Sabrina Sanchez is a 24 y.o. female, G3P2002 at 39.6 weeks, presenting for regular contractions since earlier this evening. +FM. Denies leaking or bleeding.  Desires Waterbirth; birthplan in hand. Had successful Waterbirth in 2014. Family & doula Sabrina Sanchez) at bedside.  Refuses admission labs and saline lock.  Patient Active Problem List   Diagnosis Date Noted  . BMI 30.0-30.9,adult 11/12/2014  . Marginal insertion of umbilical cord 11/12/2014  . Normal labor 11/11/2014    History of present pregnancy: Patient entered care with [facility] at 31 weeks, transferred in from [facility] due to preferences for providers at [facility].  EDC of 11/12/14 was established by sure LMP and that was c/w u/s at 11.1 wks.   Anatomy scan: No record.   Additional [place] evaluations:  30.1 wks f/u u/s for marginal cord insertion: EFW 3+8 (82nd%tile). Posterior placenta. 33.3 wks for growth and MI of cord: EFW 4+15 (48th%tile, 2254 g). AFI 14.1 cm (50th%tile -- normal fluid). Posterior fundal placenta. Stable marginal placental cord insertion. Cvx 3.99 cm -- closed. Significant prenatal events w/o significant findings -- pruritic rash to right leg, pelvic pain and LE swelling.  Marginal cord insertion noted on ? Anatomy scan. Began having ctxs around 38 wks. No MAU visits. TWG 13 lbs. Hoping to avoid OP presentation -- going to Chiropractor.   Last evaluation: Office 11/10/14 @ 39.5 wks by [facility], NP. FHR 140 bpm. BP 100/60. Wt 195 lbs.  OB History    Gravida Para Term Preterm AB TAB SAB Ectopic Multiple Living   0 0 0 0 0 0 2    SVD on 04/29/2011 @ 42.0 wks, female infant, "Sabrina Sanchez", birthwt 7+14. Attempted home birth with Clois Dupes, CNM--35-hour labor, with OP baby.  Had MSF as labor continued--transported to [facility], received Pitocin. Delivered with [facility]. Had 15 min pushing stage. Used tub at home.    SVD (Waterbirth) on 03/02/2013 @ 39.4 wks, 32-hr labor, female infant,  "Sabrina Sanchez", birthwt 7+13.6 (3561 g), WHG -- delivered by Nigel Bridgeman, CNM.   Past Medical History  Diagnosis Date  . Infection     UTI X 1  . Infection     OCC YEAST  . Fracture of wrist AGE 50 AND AGE 89   Past Surgical History  Procedure Laterality Date  . Eye surgery  AGE 49   Family History: family history includes Asthma in her maternal aunt; Hyperlipidemia in her maternal grandmother; Thrombophlebitis in her maternal grandmother. There is no history of Anesthesia problems. Social History:  reports that she has never smoked. She has never used smokeless tobacco. She reports that she does not drink alcohol or use illicit drugs. 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 She is of the [place] faith and will accept blood in an emergency.  Prenatal Transfer Tool  Maternal Diabetes: No Genetic Screening: Declined Maternal Ultrasounds/Referrals: Normal Fetal Ultrasounds or other Referrals:  None Maternal Substance Abuse:  No Significant Maternal Medications:  Meds include: Other: PNVs, Vitamins C & D, Iron Significant Maternal Lab Results: Lab values include: Group B Strep negative  TDAP: NA Flu: NA  ROS: 10+ point review of systems negative except as detailed in HPI.    No Known Allergies   Dilation: 5 Effacement (%): 80 Station: -3 Exam by:: K. Vayla Wilhelmi CNM Blood pressure 124/59, pulse 102, temperature 97.5 F (36.4 C), temperature source Oral, resp.  rate 18, height 5\' 7"  (1.702 m), weight 88.451 kg (195 lb), last menstrual period 02/06/2014, unknown if currently breastfeeding.  Chest clear Heart RRR without murmur Abd gravid, NT, FH CWD Pelvic: As above. BBOW -- ballotable Ext: WNL Cephalic by Leopolds and VE Bishop score: 10 EFW: 7+12   FHR: Category 1 UCs:  Irregular  Prenatal labs: ABO, Rh: B/Positive/-- (12/14 0000) Antibody:   Rubella:   Immune RPR:  Nonreactive (12/14 0000)  HBsAg: Negative (12/14 0000)  HIV: Non-reactive (12/14 0000)  GBS: Negative (06/07 0000) Sickle cell/Hgb electrophoresis: Normal study Pap: Normal on 05/01/14 GC: Neg on 06/01/14 Chlamydia: Neg on 06/01/14 Genetic screenings: Declined Glucola: Normal at 103  Hgb 12.6 at NOB, 11.4 at 28 weeks  Assessment: IUP at 39.6 wks Active Labor GBS neg Desires Waterbirth w/ non-interventive labor Marginal cord insertion  Plan: Admit to [employer] Routine [facility] orders Expectant management Intermittent monitoring Candidate for [place] - consent obtained Expect progress and SVD   Robyne Askew, MS 11/11/14, 11:14 PM

## 2014-11-11 NOTE — MAU Provider Note (Signed)
Cale Moraitis is a 24 y.o. G3P2 at 39.2 weeks pt called earlier today to request an [place] to check placement.  She was informed that the MAU/ER is for emergencies and or labor checks and this type of visit is an office visit.  She states she wants to be check so she does not labor at home and will require a CS anyway.  Once again she was offered an office appointment on Monday as this was not an MAU appropriate visit.  She then asked how far apart does the ctx need to be before coming in?  Labor precautions were explained.  She denies vb, lof s/+FM.  The FOB states she came to confirm position.  Pt states she is planning a water birth   History     Patient Active Problem List   Diagnosis Date Noted  . Vaginal delivery 03/02/2013  . Second-degree perineal laceration, with delivery 03/02/2013    Chief Complaint  Patient presents with  . Labor Eval   HPI  OB History    Gravida Para Term Preterm AB TAB SAB Ectopic Multiple Living   3 2 2  0 0 0 0 0 0 2      Past Medical History  Diagnosis Date  . Infection     UTI X 1  . Infection     OCC YEAST  . Fracture of wrist AGE 89 AND AGE 4    Past Surgical History  Procedure Laterality Date  . Eye surgery  AGE 43    Family History  Problem Relation Age of Onset  . Anesthesia problems Neg Hx   . Asthma Maternal Aunt   . Hyperlipidemia Maternal Grandmother   . Thrombophlebitis Maternal Grandmother     History  Substance Use Topics  . Smoking status: Never Smoker   . Smokeless tobacco: Never Used  . Alcohol Use: No    Allergies: No Known Allergies  Prescriptions prior to admission  Medication Sig Dispense Refill Last Dose  . ferrous sulfate 325 (65 FE) MG tablet Take 325 mg by mouth daily with breakfast.   11/11/2014 at Unknown time  . Prenatal Vit-Fe Fumarate-FA (PRENATAL MULTIVITAMIN) TABS tablet Take 1 tablet by mouth daily at 12 noon.   11/11/2014 at Unknown time    ROS See HPI above, all other systems are negative  Physical  Exam   Blood pressure 118/52, pulse 111, temperature 98 F (36.7 C), temperature source Oral, resp. rate 18, last menstrual period 02/06/2014, unknown if currently breastfeeding.  Physical Exam Ext:  WNL ABD: Soft, non tender to palpation, no rebound or guarding SVE: 3-4/60/-3 ballottable vertex    ED Course  Assessment: IUP at  39.5weeks Membranes: intact FHR: Category 1 CTX:  occasional   Plan: Pt given 3 options 1) stay in MAU and be rechecked in 1 hour 2) walked for 1 hour and be rechecked in 1 hour 3) go home and return when labor progress  Pt elected to go home. She reports she lives less than 15 minutes away.  DC to home in stable condition Labor and bleeding precautions Kick count instructions  Andrian Urbach, CNM, MSN 11/11/2014. 5:13 PM

## 2014-11-11 NOTE — MAU Note (Signed)
Pt presents complaining of more frequent contractions since discharged from MAU. Denies bleeding or leaking. Reports good fetal movement.

## 2014-11-11 NOTE — Discharge Instructions (Signed)
Reasons to return to MAU: ° °1.  Contractions are  5 minutes apart or less, each last 1 minute, these have been going on for 1-2 hours, and you cannot walk or talk during them °2.  You have a large gush of fluid, or a trickle of fluid that will not stop and you have to wear a pad °3.  You have bleeding that is bright red, heavier than spotting--like menstrual bleeding (spotting can be normal in early labor or after a check of your cervix) °4.  You do not feel the baby moving like he/she normally does ° °Fetal Movement Counts °Patient Name: __________________________________________________ Patient Due Date: ____________________ °Performing a fetal movement count is highly recommended in high-risk pregnancies, but it is good for every pregnant woman to do. Your health care provider may ask you to start counting fetal movements at 28 weeks of the pregnancy. Fetal movements often increase: °· After eating a full meal. °· After physical activity. °· After eating or drinking something sweet or cold. °· At rest. °Pay attention to when you feel the baby is most active. This will help you notice a pattern of your baby's sleep and wake cycles and what factors contribute to an increase in fetal movement. It is important to perform a fetal movement count at the same time each day when your baby is normally most active.  °HOW TO COUNT FETAL MOVEMENTS °1. Find a quiet and comfortable area to sit or lie down on your left side. Lying on your left side provides the best blood and oxygen circulation to your baby. °2. Write down the day and time on a sheet of paper or in a journal. °3. Start counting kicks, flutters, swishes, rolls, or jabs in a 2-hour period. You should feel at least 10 movements within 2 hours. °4. If you do not feel 10 movements in 2 hours, wait 2-3 hours and count again. Look for a change in the pattern or not enough counts in 2 hours. °SEEK MEDICAL CARE IF: °· You feel less than 10 counts in 2 hours, tried  twice. °· There is no movement in over an hour. °· The pattern is changing or taking longer each day to reach 10 counts in 2 hours. °· You feel the baby is not moving as he or she usually does. °Date: ____________ Movements: ____________ Start time: ____________ Finish time: ____________  °Date: ____________ Movements: ____________ Start time: ____________ Finish time: ____________ °Date: ____________ Movements: ____________ Start time: ____________ Finish time: ____________ °Date: ____________ Movements: ____________ Start time: ____________ Finish time: ____________ °Date: ____________ Movements: ____________ Start time: ____________ Finish time: ____________ °Date: ____________ Movements: ____________ Start time: ____________ Finish time: ____________ °Date: ____________ Movements: ____________ Start time: ____________ Finish time: ____________ °Date: ____________ Movements: ____________ Start time: ____________ Finish time: ____________  °Date: ____________ Movements: ____________ Start time: ____________ Finish time: ____________ °Date: ____________ Movements: ____________ Start time: ____________ Finish time: ____________ °Date: ____________ Movements: ____________ Start time: ____________ Finish time: ____________ °Date: ____________ Movements: ____________ Start time: ____________ Finish time: ____________ °Date: ____________ Movements: ____________ Start time: ____________ Finish time: ____________ °Date: ____________ Movements: ____________ Start time: ____________ Finish time: ____________ °Date: ____________ Movements: ____________ Start time: ____________ Finish time: ____________  °Date: ____________ Movements: ____________ Start time: ____________ Finish time: ____________ °Date: ____________ Movements: ____________ Start time: ____________ Finish time: ____________ °Date: ____________ Movements: ____________ Start time: ____________ Finish time: ____________ °Date: ____________ Movements:  ____________ Start time: ____________ Finish time: ____________ °Date: ____________ Movements: ____________ Start time: ____________   Finish time: ____________ °Date: ____________ Movements: ____________ Start time: ____________ Finish time: ____________ °Date: ____________ Movements: ____________ Start time: ____________ Finish time: ____________  °Date: ____________ Movements: ____________ Start time: ____________ Finish time: ____________ °Date: ____________ Movements: ____________ Start time: ____________ Finish time: ____________ °Date: ____________ Movements: ____________ Start time: ____________ Finish time: ____________ °Date: ____________ Movements: ____________ Start time: ____________ Finish time: ____________ °Date: ____________ Movements: ____________ Start time: ____________ Finish time: ____________ °Date: ____________ Movements: ____________ Start time: ____________ Finish time: ____________ °Date: ____________ Movements: ____________ Start time: ____________ Finish time: ____________  °Date: ____________ Movements: ____________ Start time: ____________ Finish time: ____________ °Date: ____________ Movements: ____________ Start time: ____________ Finish time: ____________ °Date: ____________ Movements: ____________ Start time: ____________ Finish time: ____________ °Date: ____________ Movements: ____________ Start time: ____________ Finish time: ____________ °Date: ____________ Movements: ____________ Start time: ____________ Finish time: ____________ °Date: ____________ Movements: ____________ Start time: ____________ Finish time: ____________ °Date: ____________ Movements: ____________ Start time: ____________ Finish time: ____________  °Date: ____________ Movements: ____________ Start time: ____________ Finish time: ____________ °Date: ____________ Movements: ____________ Start time: ____________ Finish time: ____________ °Date: ____________ Movements: ____________ Start time: ____________ Finish  time: ____________ °Date: ____________ Movements: ____________ Start time: ____________ Finish time: ____________ °Date: ____________ Movements: ____________ Start time: ____________ Finish time: ____________ °Date: ____________ Movements: ____________ Start time: ____________ Finish time: ____________ °Date: ____________ Movements: ____________ Start time: ____________ Finish time: ____________  °Date: ____________ Movements: ____________ Start time: ____________ Finish time: ____________ °Date: ____________ Movements: ____________ Start time: ____________ Finish time: ____________ °Date: ____________ Movements: ____________ Start time: ____________ Finish time: ____________ °Date: ____________ Movements: ____________ Start time: ____________ Finish time: ____________ °Date: ____________ Movements: ____________ Start time: ____________ Finish time: ____________ °Date: ____________ Movements: ____________ Start time: ____________ Finish time: ____________ °Date: ____________ Movements: ____________ Start time: ____________ Finish time: ____________  °Date: ____________ Movements: ____________ Start time: ____________ Finish time: ____________ °Date: ____________ Movements: ____________ Start time: ____________ Finish time: ____________ °Date: ____________ Movements: ____________ Start time: ____________ Finish time: ____________ °Date: ____________ Movements: ____________ Start time: ____________ Finish time: ____________ °Date: ____________ Movements: ____________ Start time: ____________ Finish time: ____________ °Date: ____________ Movements: ____________ Start time: ____________ Finish time: ____________ °Document Released: 06/04/2006 Document Revised: 09/19/2013 Document Reviewed: 03/01/2012 °[employer]® Patient Information ©2015 [employer]. This information is not intended to replace advice given to you by your health care provider. Make sure you discuss any questions you have with your health care  provider. ° °

## 2014-11-11 NOTE — MAU Note (Signed)
Patient presents at [redacted] weeks gestation for labor evaluation. Fetus active. Denies bleeding or discharge.

## 2014-11-12 ENCOUNTER — Encounter (HOSPITAL_COMMUNITY): Payer: Self-pay

## 2014-11-12 DIAGNOSIS — IMO0002 Reserved for concepts with insufficient information to code with codable children: Secondary | ICD-10-CM

## 2014-11-12 DIAGNOSIS — Z6830 Body mass index (BMI) 30.0-30.9, adult: Secondary | ICD-10-CM

## 2014-11-12 LAB — RPR: RPR Ser Ql: NONREACTIVE

## 2014-11-12 LAB — TYPE AND SCREEN
ABO/RH(D): B POS
Antibody Screen: NEGATIVE

## 2014-11-12 LAB — CBC
HCT: 34.2 % — ABNORMAL LOW (ref 36.0–46.0)
Hemoglobin: 11.3 g/dL — ABNORMAL LOW (ref 12.0–15.0)
MCH: 25.6 pg — ABNORMAL LOW (ref 26.0–34.0)
MCHC: 33 g/dL (ref 30.0–36.0)
MCV: 77.4 fL — ABNORMAL LOW (ref 78.0–100.0)
PLATELETS: 192 10*3/uL (ref 150–400)
RBC: 4.42 MIL/uL (ref 3.87–5.11)
RDW: 15.1 % (ref 11.5–15.5)
WBC: 17.2 10*3/uL — ABNORMAL HIGH (ref 4.0–10.5)

## 2014-11-12 LAB — ABO/RH: ABO/RH(D): B POS

## 2014-11-12 MED ORDER — ZOLPIDEM TARTRATE 5 MG PO TABS: 5.0000 mg | ORAL_TABLET | Freq: Every evening | ORAL | Status: DC | PRN

## 2014-11-12 MED ORDER — PRENATAL MULTIVITAMIN CH
1.0000 | ORAL_TABLET | Freq: Every day | ORAL | Status: DC
  Administered 2014-11-12 – 2014-11-13 (×2): 1 via ORAL
  Filled 2014-11-12 (×2): qty 1

## 2014-11-12 MED ORDER — SENNOSIDES-DOCUSATE SODIUM 8.6-50 MG PO TABS
2.0000 | ORAL_TABLET | ORAL | Status: DC
  Administered 2014-11-13: 2 via ORAL
  Filled 2014-11-12: qty 2

## 2014-11-12 MED ORDER — SIMETHICONE 80 MG PO CHEW: 80.0000 mg | CHEWABLE_TABLET | ORAL | Status: DC | PRN

## 2014-11-12 MED ORDER — BENZOCAINE-MENTHOL 20-0.5 % EX AERO
1.0000 "application " | INHALATION_SPRAY | CUTANEOUS | Status: DC | PRN
  Administered 2014-11-12: 1 via TOPICAL
  Filled 2014-11-12 (×2): qty 56

## 2014-11-12 MED ORDER — ONDANSETRON HCL 4 MG/2ML IJ SOLN: 4.0000 mg | INTRAMUSCULAR | Status: DC | PRN

## 2014-11-12 MED ORDER — METHYLERGONOVINE MALEATE 0.2 MG PO TABS: 0.2000 mg | ORAL_TABLET | ORAL | Status: DC | PRN

## 2014-11-12 MED ORDER — DIPHENHYDRAMINE HCL 25 MG PO CAPS: 25.0000 mg | ORAL_CAPSULE | Freq: Four times a day (QID) | ORAL | Status: DC | PRN

## 2014-11-12 MED ORDER — IBUPROFEN 600 MG PO TABS
600.0000 mg | ORAL_TABLET | Freq: Four times a day (QID) | ORAL | Status: DC
  Administered 2014-11-12 – 2014-11-13 (×5): 600 mg via ORAL
  Filled 2014-11-12 (×5): qty 1

## 2014-11-12 MED ORDER — WITCH HAZEL-GLYCERIN EX PADS: 1.0000 "application " | MEDICATED_PAD | CUTANEOUS | Status: DC | PRN

## 2014-11-12 MED ORDER — TETANUS-DIPHTH-ACELL PERTUSSIS 5-2.5-18.5 LF-MCG/0.5 IM SUSP: 0.5000 mL | Freq: Once | INTRAMUSCULAR | Status: DC

## 2014-11-12 MED ORDER — FERROUS SULFATE 325 (65 FE) MG PO TABS
325.0000 mg | ORAL_TABLET | Freq: Two times a day (BID) | ORAL | Status: DC
  Administered 2014-11-12 – 2014-11-13 (×2): 325 mg via ORAL
  Filled 2014-11-12 (×2): qty 1

## 2014-11-12 MED ORDER — ACETAMINOPHEN 325 MG PO TABS
650.0000 mg | ORAL_TABLET | ORAL | Status: DC | PRN
  Administered 2014-11-12 – 2014-11-13 (×4): 650 mg via ORAL
  Filled 2014-11-12 (×4): qty 2

## 2014-11-12 MED ORDER — OXYTOCIN 10 UNIT/ML IJ SOLN
10.0000 [IU] | Freq: Once | INTRAMUSCULAR | Status: AC | PRN
Start: 2014-11-12 — End: 2014-11-12
  Administered 2014-11-12: 10 [IU] via INTRAMUSCULAR
  Filled 2014-11-12: qty 1

## 2014-11-12 MED ORDER — OXYCODONE-ACETAMINOPHEN 5-325 MG PO TABS: 1.0000 | ORAL_TABLET | ORAL | Status: DC | PRN

## 2014-11-12 MED ORDER — DIBUCAINE 1 % RE OINT
1.0000 "application " | TOPICAL_OINTMENT | RECTAL | Status: DC | PRN
  Administered 2014-11-12: 1 via RECTAL
  Filled 2014-11-12: qty 28

## 2014-11-12 MED ORDER — OXYCODONE-ACETAMINOPHEN 5-325 MG PO TABS: 2.0000 | ORAL_TABLET | ORAL | Status: DC | PRN

## 2014-11-12 MED ORDER — METHYLERGONOVINE MALEATE 0.2 MG/ML IJ SOLN: 0.2000 mg | INTRAMUSCULAR | Status: DC | PRN

## 2014-11-12 MED ORDER — LANOLIN HYDROUS EX OINT: TOPICAL_OINTMENT | CUTANEOUS | Status: DC | PRN

## 2014-11-12 MED ORDER — ONDANSETRON HCL 4 MG PO TABS: 4.0000 mg | ORAL_TABLET | ORAL | Status: DC | PRN

## 2014-11-12 NOTE — Progress Notes (Signed)
  Subjective: Feeling urge to push w/ each ctx. Family at bedside.  Objective: BP 124/59 mmHg  Pulse 102  Temp(Src) 97.5 F (36.4 C) (Oral)  Resp 18  Ht 5\' 7"  (1.702 m)  Wt 195 lb (88.451 kg)  BMI 30.53 kg/m2  LMP 02/06/2014     Today's Vitals   11/11/14 2254 11/11/14 2255 11/11/14 2336  BP: 122/80  124/59  Pulse: 112  102  Temp: 98 F (36.7 C)  97.5 F (36.4 C)  TempSrc:   Oral  Resp: 20  18  Height:   5\' 7"  (1.702 m)  Weight:   195 lb (88.451 kg)  PainSc:  10-Worst pain ever    FHT: Category 1 -- intermittent monitoring UC:   irregular, every 3-5 minutes SVE:   Dilation: 5 Effacement (%): 80 Station: -2, -1 Exam by:: K. Damani Rando, cnm  Assessment:  IUP at term Minimal change. R/B of AROM reviewed -- pt elected to proceed -- clear fluid at 03:13 AM.   Plan: Expect progress and SVD   Sherre Scarlet CNM 11/12/2014, 3:39 AM

## 2014-11-12 NOTE — Progress Notes (Signed)
  Subjective: Strong urge to push  Objective: BP 113/59 mmHg  Pulse 96  Temp(Src) 98.2 F (36.8 C) (Oral)  Resp 18  Ht 5\' 7"  (1.702 m)  Wt 195 lb (88.451 kg)  BMI 30.53 kg/m2  LMP 02/06/2014      FHT: Category 1 -- intermittent UC:   irregular, every 4-5 minutes SVE: Deferred -- pushing involuntarily     Assessment:  Cat 1 FHRT Entering 2nd stage labor  Plan: Expect SVD briefly  Sherre Scarlet CNM 11/12/2014, 5:44 AM

## 2014-11-12 NOTE — Discharge Summary (Signed)
Vaginal Delivery Discharge Summary  ALL information will be verified prior to discharge  Sabrina Sanchez  DOB:    [birth year] MRN:    [id number] CSN:    [id number]  Date of admission:                  11/11/14  Date of discharge:                   11/13/14  Procedures this admission: SVD  Date of Delivery: 11/12/14  Newborn Data:  Live born  Information for the patient's newborn:  Sabrina, Sanchez [[id number]]  female   Live born female  Birth Weight: 8 lb 4.5 oz (3756 g) APGAR: 7, 9  Home with mother. Name: Sabrina Sanchez Circumcision Plan: out patient   History of Present Illness: [name] is a 24 y.o. female, G3P3003, who presents at [redacted]w[redacted]d weeks gestation. The patient has been followed at the [facility] and Gynecology division of [facility]. She was admitted onset of labor. Her pregnancy has been complicated by:  Patient Active Problem List   Diagnosis Date Noted  . BMI 30.0-30.9,adult 11/12/2014  . Marginal insertion of umbilical cord 11/12/2014  . Vaginal delivery 11/12/2014  . Second-degree perineal laceration, with delivery 11/12/2014    Hospital course: The patient was admitted for labor.   Her labor was not complicated. She proceeded to have a vaginal delivery of a healthy infant. Her delivery was not complicated. Her postpartum course was not complicated. She was discharged to home on postpartum day 1 doing well.  Feeding: breast  Contraception: no method   Discharge hemoglobin: HEMOGLOBIN  Date Value Ref Range Status  11/12/2014 11.3* 12.0 - 15.0 g/dL Final   HCT  Date Value Ref Range Status  11/12/2014 34.2* 36.0 - 46.0 % Final    PreNatal Labs ABO, Rh: --/--/B POS (06/26 0900)   Antibody: NEG (06/26 0900) Rubella:    immune RPR: Nonreactive (12/14 0000)  HBsAg: Negative (12/14 0000)  HIV: Non-reactive (12/14 0000)  GBS: Negative (06/07 0000)  Discharge Physical Exam:  General: alert and cooperative Lochia:  appropriate Uterine Fundus: firm Incision: healing well DVT Evaluation: No evidence of DVT seen on physical exam.  Intrapartum Procedures: spontaneous vaginal delivery Postpartum Procedures: none Complications-Operative and Postpartum: 2 degree perineal laceration  Discharge Diagnoses: Term Pregnancy-delivered,  asymptomatic anemia  Activity:           pelvic rest Diet:                routine Medications: PNV, Ibuprofen, Iron and Percocet Condition:      stable     Postpartum Teaching: Nutrition, exercise, return to work or school, family visits, sexual activity, home rest, vaginal bleeding, pelvic rest, family planning, s/s of PPD, breast care peri-care and incision care   Discharge to: home     Sabrina Sanchez, CNM, MSN 11/12/2014. 11:51 AM  All information will be verified prior to discharge   Postpartum Care After Vaginal Delivery  After you deliver your newborn (postpartum period), the usual stay in the hospital is 24 72 hours. If there were problems with your labor or delivery, or if you have other medical problems, you might be in the hospital longer.  While you are in the hospital, you will receive help and instructions on how to care for yourself and your newborn during the postpartum period.  While you are in the hospital:  Be sure to tell your nurses if  you have pain or discomfort, as well as where you feel the pain and what makes the pain worse.  If you had an incision made near your vagina (episiotomy) or if you had some tearing during delivery, the nurses may put ice packs on your episiotomy or tear. The ice packs may help to reduce the pain and swelling.  If you are breastfeeding, you may feel uncomfortable contractions of your uterus for a couple of weeks. This is normal. The contractions help your uterus get back to normal size.  It is normal to have some bleeding after delivery.  For the first 1 3 days after delivery, the flow is red and the amount may be  similar to a period.  It is common for the flow to start and stop.  In the first few days, you may pass some small clots. Let your nurses know if you begin to pass large clots or your flow increases.  Do not  flush blood clots down the toilet before having the nurse look at them.  During the next 3 10 days after delivery, your flow should become more watery and pink or brown-tinged in color.  Ten to fourteen days after delivery, your flow should be a small amount of yellowish-white discharge.  The amount of your flow will decrease over the first few weeks after delivery. Your flow may stop in 6 8 weeks. Most women have had their flow stop by 12 weeks after delivery.  You should change your sanitary pads frequently.  Wash your hands thoroughly with soap and water for at least 20 seconds after changing pads, using the toilet, or before holding or feeding your newborn.  You should feel like you need to empty your bladder within the first 6 8 hours after delivery.  In case you become weak, lightheaded, or faint, call your nurse before you get out of bed for the first time and before you take a shower for the first time.  Within the first few days after delivery, your breasts may begin to feel tender and full. This is called engorgement. Breast tenderness usually goes away within 48 72 hours after engorgement occurs. You may also notice milk leaking from your breasts. If you are not breastfeeding, do not stimulate your breasts. Breast stimulation can make your breasts produce more milk.  Spending as much time as possible with your newborn is very important. During this time, you and your newborn can feel close and get to know each other. Having your newborn stay in your room (rooming in) will help to strengthen the bond with your newborn. It will give you time to get to know your newborn and become comfortable caring for your newborn.  Your hormones change after delivery. Sometimes the hormone  changes can temporarily cause you to feel sad or tearful. These feelings should not last more than a few days. If these feelings last longer than that, you should talk to your caregiver.  If desired, talk to your caregiver about methods of family planning or contraception.  Talk to your caregiver about immunizations. Your caregiver may want you to have the following immunizations before leaving the hospital:  Tetanus, diphtheria, and pertussis (Tdap) or tetanus and diphtheria (Td) immunization. It is very important that you and your family (including grandparents) or others caring for your newborn are up-to-date with the Tdap or Td immunizations. The Tdap or Td immunization can help protect your newborn from getting ill.  Rubella immunization.  Varicella (chickenpox) immunization.  Influenza immunization. You should receive this annual immunization if you did not receive the immunization during your pregnancy. Document Released: 03/02/2007 Document Revised: 01/28/2012 Document Reviewed: 12/31/2011 [facility] Patient Information 2014 [place], [place].   Postpartum Depression and Baby Blues  The postpartum period begins right after the birth of a baby. During this time, there is often a great amount of joy and excitement. It is also a time of considerable changes in the life of the parent(s). Regardless of how many times a mother gives birth, each child brings new challenges and dynamics to the family. It is not unusual to have feelings of excitement accompanied by confusing shifts in moods, emotions, and thoughts. All mothers are at risk of developing postpartum depression or the "baby blues." These mood changes can occur right after giving birth, or they may occur many months after giving birth. The baby blues or postpartum depression can be mild or severe. Additionally, postpartum depression can resolve rather quickly, or it can be a long-term condition. CAUSES Elevated hormones and their rapid  decline are thought to be a main cause of postpartum depression and the baby blues. There are a number of hormones that radically change during and after pregnancy. Estrogen and progesterone usually decrease immediately after delivering your baby. The level of thyroid hormone and various cortisol steroids also rapidly drop. Other factors that play a major role in these changes include major life events and genetics.  RISK FACTORS If you have any of the following risks for the baby blues or postpartum depression, know what symptoms to watch out for during the postpartum period. Risk factors that may increase the likelihood of getting the baby blues or postpartum depression include: 1. Havinga personal or family history of depression. 2. Having depression while being pregnant. 3. Having premenstrual or oral contraceptive-associated mood issues. 4. Having exceptional life stress. 5. Having marital conflict. 6. Lacking a social support network. 7. Having a baby with special needs. 8. Having health problems such as diabetes. SYMPTOMS Baby blues symptoms include:  Brief fluctuations in mood, such as going from extreme happiness to sadness.  Decreased concentration.  Difficulty sleeping.  Crying spells, tearfulness.  Irritability.  Anxiety. Postpartum depression symptoms typically begin within the first month after giving birth. These symptoms include:  Difficulty sleeping or excessive sleepiness.  Marked weight loss.  Agitation.  Feelings of worthlessness.  Lack of interest in activity or food. Postpartum psychosis is a very concerning condition and can be dangerous. Fortunately, it is rare. Displaying any of the following symptoms is cause for immediate medical attention. Postpartum psychosis symptoms include:  Hallucinations and delusions.  Bizarre or disorganized behavior.  Confusion or disorientation. DIAGNOSIS  A diagnosis is made by an evaluation of your symptoms. There  are no medical or lab tests that lead to a diagnosis, but there are various questionnaires that a caregiver may use to identify those with the baby blues, postpartum depression, or psychosis. Often times, a screening tool called the [place] Postnatal Depression Scale is used to diagnose depression in the postpartum period.  TREATMENT The baby blues usually goes away on its own in 1 to 2 weeks. Social support is often all that is needed. You should be encouraged to get adequate sleep and rest. Occasionally, you may be given medicines to help you sleep.  Postpartum depression requires treatment as it can last several months or longer if it is not treated. Treatment may include individual or group therapy, medicine, or both to address any social, physiological, and  psychological factors that may play a role in the depression. Regular exercise, a healthy diet, rest, and social support may also be strongly recommended.  Postpartum psychosis is more serious and needs treatment right away. Hospitalization is often needed. HOME CARE INSTRUCTIONS  Get as much rest as you can. Nap when the baby sleeps.  Exercise regularly. Some women find yoga and walking to be beneficial.  Eat a balanced and nourishing diet.  Do little things that you enjoy. Have a cup of tea, take a bubble bath, read your favorite magazine, or listen to your favorite music.  Avoid alcohol.  Ask for help with household chores, cooking, grocery shopping, or running errands as needed. Do not try to do everything.  Talk to people close to you about how you are feeling. Get support from your partner, family members, friends, or other new moms.  Try to stay positive in how you think. Think about the things you are grateful for.  Do not spend a lot of time alone.  Only take medicine as directed by your caregiver.  Keep all your postpartum appointments.  Let your caregiver know if you have any concerns. SEEK MEDICAL CARE IF: You are  having a reaction or problems with your medicine. SEEK IMMEDIATE MEDICAL CARE IF:  You have suicidal feelings.  You feel you may harm the baby or someone else. Document Released: 02/07/2004 Document Revised: 07/28/2011 Document Reviewed: 03/11/2011 [facility] Patient Information 2014 [place], [place].     Breastfeeding Deciding to breastfeed is one of the best choices you can make for you and your baby. A change in hormones during pregnancy causes your breast tissue to grow and increases the number and size of your milk ducts. These hormones also allow proteins, sugars, and fats from your blood supply to make breast milk in your milk-producing glands. Hormones prevent breast milk from being released before your baby is born as well as prompt milk flow after birth. Once breastfeeding has begun, thoughts of your baby, as well as his or her sucking or crying, can stimulate the release of milk from your milk-producing glands.  BENEFITS OF BREASTFEEDING For Your Baby  Your first milk (colostrum) helps your baby's digestive system function better.   There are antibodies in your milk that help your baby fight off infections.   Your baby has a lower incidence of asthma, allergies, and sudden infant death syndrome.   The nutrients in breast milk are better for your baby than infant formulas and are designed uniquely for your baby's needs.   Breast milk improves your baby's brain development.   Your baby is less likely to develop other conditions, such as childhood obesity, asthma, or type 2 diabetes mellitus.  For You   Breastfeeding helps to create a very special bond between you and your baby.   Breastfeeding is convenient. Breast milk is always available at the correct temperature and costs nothing.   Breastfeeding helps to burn calories and helps you lose the weight gained during pregnancy.   Breastfeeding makes your uterus contract to its prepregnancy size faster and slows  bleeding (lochia) after you give birth.   Breastfeeding helps to lower your risk of developing type 2 diabetes mellitus, osteoporosis, and breast or ovarian cancer later in life. SIGNS THAT YOUR BABY IS HUNGRY Early Signs of Hunger  Increased alertness or activity.  Stretching.  Movement of the head from side to side.  Movement of the head and opening of the mouth when the corner of the  mouth or cheek is stroked (rooting).  Increased sucking sounds, smacking lips, cooing, sighing, or squeaking.  Hand-to-mouth movements.  Increased sucking of fingers or hands. Late Signs of Hunger  Fussing.  Intermittent crying. Extreme Signs of Hunger Signs of extreme hunger will require calming and consoling before your baby will be able to breastfeed successfully. Do not wait for the following signs of extreme hunger to occur before you initiate breastfeeding:   Restlessness.  A loud, strong cry.   Screaming.   BREASTFEEDING BASICS Breastfeeding Initiation  Find a comfortable place to sit or lie down, with your neck and back well supported.  Place a pillow or rolled up blanket under your baby to bring him or her to the level of your breast (if you are seated). Nursing pillows are specially designed to help support your arms and your baby while you breastfeed.  Make sure that your baby's abdomen is facing your abdomen.   Gently massage your breast. With your fingertips, massage from your chest wall toward your nipple in a circular motion. This encourages milk flow. You may need to continue this action during the feeding if your milk flows slowly.  Support your breast with 4 fingers underneath and your thumb above your nipple. Make sure your fingers are well away from your nipple and your baby's mouth.   Stroke your baby's lips gently with your finger or nipple.   When your baby's mouth is open wide enough, quickly bring your baby to your breast, placing your entire nipple and  as much of the colored area around your nipple (areola) as possible into your baby's mouth.   More areola should be visible above your baby's upper lip than below the lower lip.   Your baby's tongue should be between his or her lower gum and your breast.   Ensure that your baby's mouth is correctly positioned around your nipple (latched). Your baby's lips should create a seal on your breast and be turned out (everted).  It is common for your baby to suck about 2-3 minutes in order to start the flow of breast milk. Latching Teaching your baby how to latch on to your breast properly is very important. An improper latch can cause nipple pain and decreased milk supply for you and poor weight gain in your baby. Also, if your baby is not latched onto your nipple properly, he or she may swallow some air during feeding. This can make your baby fussy. Burping your baby when you switch breasts during the feeding can help to get rid of the air. However, teaching your baby to latch on properly is still the best way to prevent fussiness from swallowing air while breastfeeding. Signs that your baby has successfully latched on to your nipple:    Silent tugging or silent sucking, without causing you pain.   Swallowing heard between every 3-4 sucks.    Muscle movement above and in front of his or her ears while sucking.  Signs that your baby has not successfully latched on to nipple:   Sucking sounds or smacking sounds from your baby while breastfeeding.  Nipple pain. If you think your baby has not latched on correctly, slip your finger into the corner of your baby's mouth to break the suction and place it between your baby's gums. Attempt breastfeeding initiation again. Signs of Successful Breastfeeding Signs from your baby:   A gradual decrease in the number of sucks or complete cessation of sucking.   Falling asleep.  Relaxation of his or her body.   Retention of a small amount of milk  in his or her mouth.   Letting go of your breast by himself or herself. Signs from you:  Breasts that have increased in firmness, weight, and size 1-3 hours after feeding.   Breasts that are softer immediately after breastfeeding.  Increased milk volume, as well as a change in milk consistency and color by the fifth day of breastfeeding.   Nipples that are not sore, cracked, or bleeding. Signs That Your Pecola Leisure is Getting Enough Milk  Wetting at least 3 diapers in a 24-hour period. The urine should be clear and pale yellow by age 15 days.  At least 3 stools in a 24-hour period by age 15 days. The stool should be soft and yellow.  At least 3 stools in a 24-hour period by age 143 days. The stool should be seedy and yellow.  No loss of weight greater than 10% of birth weight during the first 40 days of age.  Average weight gain of 4-7 ounces (113-198 g) per week after age 14 days.  Consistent daily weight gain by age 15 days, without weight loss after the age of 2 weeks. After a feeding, your baby may spit up a small amount. This is common. BREASTFEEDING FREQUENCY AND DURATION Frequent feeding will help you make more milk and can prevent sore nipples and breast engorgement. Breastfeed when you feel the need to reduce the fullness of your breasts or when your baby shows signs of hunger. This is called "breastfeeding on demand." Avoid introducing a pacifier to your baby while you are working to establish breastfeeding (the first 4-6 weeks after your baby is born). After this time you may choose to use a pacifier. Research has shown that pacifier use during the first year of a baby's life decreases the risk of sudden infant death syndrome (SIDS). Allow your baby to feed on each breast as long as he or she wants. Breastfeed until your baby is finished feeding. When your baby unlatches or falls asleep while feeding from the first breast, offer the second breast. Because newborns are often sleepy in the  first few weeks of life, you may need to awaken your baby to get him or her to feed. Breastfeeding times will vary from baby to baby. However, the following rules can serve as a guide to help you ensure that your baby is properly fed:  Newborns (babies 52 weeks of age or younger) may breastfeed every 1-3 hours.  Newborns should not go longer than 3 hours during the day or 5 hours during the night without breastfeeding.  You should breastfeed your baby a minimum of 8 times in a 24-hour period until you begin to introduce solid foods to your baby at around 42 months of age. BREAST MILK PUMPING Pumping and storing breast milk allows you to ensure that your baby is exclusively fed your breast milk, even at times when you are unable to breastfeed. This is especially important if you are going back to work while you are still breastfeeding or when you are not able to be present during feedings. Your lactation consultant can give you guidelines on how long it is safe to store breast milk.  A breast pump is a machine that allows you to pump milk from your breast into a sterile bottle. The pumped breast milk can then be stored in a refrigerator or freezer. Some breast pumps are operated by hand, while others  use electricity. Ask your lactation consultant which type will work best for you. Breast pumps can be purchased, but some hospitals and breastfeeding support groups lease breast pumps on a monthly basis. A lactation consultant can teach you how to hand express breast milk, if you prefer not to use a pump.  CARING FOR YOUR BREASTS WHILE YOU BREASTFEED Nipples can become dry, cracked, and sore while breastfeeding. The following recommendations can help keep your breasts moisturized and healthy:  Avoid using soap on your nipples.   Wear a supportive bra. Although not required, special nursing bras and tank tops are designed to allow access to your breasts for breastfeeding without taking off your entire bra  or top. Avoid wearing underwire-style bras or extremely tight bras.  Air dry your nipples for 3-56minutes after each feeding.   Use only cotton bra pads to absorb leaked breast milk. Leaking of breast milk between feedings is normal.   Use lanolin on your nipples after breastfeeding. Lanolin helps to maintain your skin's normal moisture barrier. If you use pure lanolin, you do not need to wash it off before feeding your baby again. Pure lanolin is not toxic to your baby. You may also hand express a few drops of breast milk and gently massage that milk into your nipples and allow the milk to air dry. In the first few weeks after giving birth, some women experience extremely full breasts (engorgement). Engorgement can make your breasts feel heavy, warm, and tender to the touch. Engorgement peaks within 3-5 days after you give birth. The following recommendations can help ease engorgement:  Completely empty your breasts while breastfeeding or pumping. You may want to start by applying warm, moist heat (in the shower or with warm water-soaked hand towels) just before feeding or pumping. This increases circulation and helps the milk flow. If your baby does not completely empty your breasts while breastfeeding, pump any extra milk after he or she is finished.  Wear a snug bra (nursing or regular) or tank top for 1-2 days to signal your body to slightly decrease milk production.  Apply ice packs to your breasts, unless this is too uncomfortable for you.  Make sure that your baby is latched on and positioned properly while breastfeeding. If engorgement persists after 48 hours of following these recommendations, contact your health care provider or a Advertising copywriter. OVERALL HEALTH CARE RECOMMENDATIONS WHILE BREASTFEEDING  Eat healthy foods. Alternate between meals and snacks, eating 3 of each per day. Because what you eat affects your breast milk, some of the foods may make your baby more irritable  than usual. Avoid eating these foods if you are sure that they are negatively affecting your baby.  Drink milk, fruit juice, and water to satisfy your thirst (about 10 glasses a day).   Rest often, relax, and continue to take your prenatal vitamins to prevent fatigue, stress, and anemia.  Continue breast self-awareness checks.  Avoid chewing and smoking tobacco.  Avoid alcohol and drug use. Some medicines that may be harmful to your baby can pass through breast milk. It is important to ask your health care provider before taking any medicine, including all over-the-counter and prescription medicine as well as vitamin and herbal supplements. It is possible to become pregnant while breastfeeding. If birth control is desired, ask your health care provider about options that will be safe for your baby. SEEK MEDICAL CARE IF:   You feel like you want to stop breastfeeding or have become frustrated with breastfeeding.  You have painful breasts or nipples.  Your nipples are cracked or bleeding.  Your breasts are red, tender, or warm.  You have a swollen area on either breast.  You have a fever or chills.  You have nausea or vomiting.  You have drainage other than breast milk from your nipples.  Your breasts do not become full before feedings by the fifth day after you give birth.  You feel sad and depressed.  Your baby is too sleepy to eat well.  Your baby is having trouble sleeping.   Your baby is wetting less than 3 diapers in a 24-hour period.  Your baby has less than 3 stools in a 24-hour period.  Your baby's skin or the white part of his or her eyes becomes yellow.   Your baby is not gaining weight by 49 days of age. SEEK IMMEDIATE MEDICAL CARE IF:   Your baby is overly tired (lethargic) and does not want to wake up and feed.  Your baby develops an unexplained fever. Document Released: 05/05/2005 Document Revised: 05/10/2013 Document Reviewed: 10/27/2012 [facility]  Patient Information 2015 [place], [place]. This information is not intended to replace advice given to you by your health care provider. Make sure you discuss any questions you have with your health care provider.

## 2014-11-12 NOTE — Lactation Note (Signed)
This note was copied from the chart of Sabrina Sanchez. Lactation Consultation Note  Patient Name: Sabrina Luray Dold CWCBJ'S Date: 11/12/2014 Reason for consult: Initial assessment Baby was nursing when [facility] arrived. Mom reports baby is nursing well but she has some soreness on the left nipple. Nipple round when baby came off the breast, slight excoriation at base of nipple. Care for sore nipples reviewed, comfort gels given with instructions. Advised apply EBM. Mom is experienced BF, denies questions/concerns. Reviewed positioning to help with deep latch. Lactation brochure left for review, advised of OP services and support groups. Encouraged to call for assist if desired.   Maternal Data Has patient been taught Hand Expression?: No (Mom reports she knows how to hand express) Does the patient have breastfeeding experience prior to this delivery?: Yes  Feeding Feeding Type: Breast Fed Length of feed: 15 min  LATCH Score/Interventions          Comfort (Breast/Nipple): Filling, red/small blisters or bruises, mild/mod discomfort  Problem noted: Mild/Moderate discomfort Interventions (Mild/moderate discomfort): Hand massage;Hand expression;Comfort gels        Lactation Tools Discussed/Used WIC Program: Yes   Consult Status Consult Status: Follow-up Date: 11/13/14 Follow-up type: In-patient    Alfred Levins 11/12/2014, 8:01 PM

## 2014-11-12 NOTE — Progress Notes (Signed)
Prior to transfer from birthing suites to mother baby lab drew patient labs.  PPH score completed post lab results; Low PPH score.  Patient refused blood draw prior to delivery.

## 2014-11-13 LAB — CBC
HCT: 28.1 % — ABNORMAL LOW (ref 36.0–46.0)
Hemoglobin: 9.1 g/dL — ABNORMAL LOW (ref 12.0–15.0)
MCH: 25.4 pg — ABNORMAL LOW (ref 26.0–34.0)
MCHC: 32.4 g/dL (ref 30.0–36.0)
MCV: 78.5 fL (ref 78.0–100.0)
Platelets: 151 10*3/uL (ref 150–400)
RBC: 3.58 MIL/uL — ABNORMAL LOW (ref 3.87–5.11)
RDW: 15.6 % — ABNORMAL HIGH (ref 11.5–15.5)
WBC: 11.3 10*3/uL — ABNORMAL HIGH (ref 4.0–10.5)

## 2014-11-13 MED ORDER — FERROUS SULFATE 325 (65 FE) MG PO TBEC: 325.0000 mg | DELAYED_RELEASE_TABLET | Freq: Two times a day (BID) | ORAL | Status: AC

## 2014-11-13 MED ORDER — IBUPROFEN 600 MG PO TABS: 600.0000 mg | ORAL_TABLET | Freq: Four times a day (QID) | ORAL | Status: DC

## 2014-11-13 MED ORDER — OXYCODONE-ACETAMINOPHEN 5-325 MG PO TABS: 1.0000 | ORAL_TABLET | ORAL | Status: DC | PRN

## 2016-03-19 ENCOUNTER — Ambulatory Visit (INDEPENDENT_AMBULATORY_CARE_PROVIDER_SITE_OTHER): Payer: BLUE CROSS/BLUE SHIELD | Admitting: *Deleted

## 2016-03-19 DIAGNOSIS — Z3201 Encounter for pregnancy test, result positive: Secondary | ICD-10-CM | POA: Diagnosis not present

## 2016-03-19 DIAGNOSIS — N926 Irregular menstruation, unspecified: Secondary | ICD-10-CM | POA: Diagnosis not present

## 2016-03-19 LAB — POCT URINE PREGNANCY: PREG TEST UR: POSITIVE — AB

## 2016-03-19 NOTE — Progress Notes (Signed)
Pt in office for upt for missed period. Pt states LMP approx 02/05/16. Pt UPT in office today is positive. Pt made aware she may schedule NOB appt at check out today if she wishes to come to our office.  Pt made aware of signs to seek emergent care as needed until she is seen for NOB. Pt made aware she may call office with any questions/concerns.

## 2016-04-17 ENCOUNTER — Encounter: Payer: BLUE CROSS/BLUE SHIELD | Admitting: Certified Nurse Midwife

## 2016-05-05 ENCOUNTER — Encounter: Payer: Self-pay | Admitting: Obstetrics & Gynecology

## 2016-05-05 ENCOUNTER — Ambulatory Visit (INDEPENDENT_AMBULATORY_CARE_PROVIDER_SITE_OTHER): Payer: BLUE CROSS/BLUE SHIELD | Admitting: Obstetrics & Gynecology

## 2016-05-05 VITALS — BP 111/76 | HR 84 | Wt 165.0 lb

## 2016-05-05 DIAGNOSIS — O30102 Triplet pregnancy, unspecified number of placenta and unspecified number of amniotic sacs, second trimester: Secondary | ICD-10-CM

## 2016-05-05 DIAGNOSIS — Z113 Encounter for screening for infections with a predominantly sexual mode of transmission: Secondary | ICD-10-CM

## 2016-05-05 DIAGNOSIS — Z3689 Encounter for other specified antenatal screening: Secondary | ICD-10-CM | POA: Diagnosis not present

## 2016-05-05 DIAGNOSIS — O30109 Triplet pregnancy, unspecified number of placenta and unspecified number of amniotic sacs, unspecified trimester: Secondary | ICD-10-CM | POA: Insufficient documentation

## 2016-05-05 DIAGNOSIS — O0990 Supervision of high risk pregnancy, unspecified, unspecified trimester: Secondary | ICD-10-CM | POA: Insufficient documentation

## 2016-05-05 DIAGNOSIS — O0992 Supervision of high risk pregnancy, unspecified, second trimester: Secondary | ICD-10-CM

## 2016-05-05 NOTE — Addendum Note (Signed)
Addended by: Jaynie CollinsANYANWU, Valentine Kuechle A on: 05/05/2016 04:13 PM   Modules accepted: Orders

## 2016-05-05 NOTE — Progress Notes (Signed)
Bedside abdominal US performed, three gestational sacs noted and three fetuses with +FHR,  HC measuring 14w 0d.

## 2016-05-05 NOTE — Patient Instructions (Signed)
Second Trimester of Pregnancy The second trimester is from week 13 through week 28 (months 4 through 6). The second trimester is often a time when you feel your best. Your body has also adjusted to being pregnant, and you begin to feel better physically. Usually, morning sickness has lessened or quit completely, you may have more energy, and you may have an increase in appetite. The second trimester is also a time when the fetus is growing rapidly. At the end of the sixth month, the fetus is about 9 inches long and weighs about 1 pounds. You will likely begin to feel the baby move (quickening) between 18 and 20 weeks of the pregnancy. Body changes during your second trimester Your body continues to go through many changes during your second trimester. The changes vary from woman to woman.  Your weight will continue to increase. You will notice your lower abdomen bulging out.  You may begin to get stretch marks on your hips, abdomen, and breasts.  You may develop headaches that can be relieved by medicines. The medicines should be approved by your health care provider.  You may urinate more often because the fetus is pressing on your bladder.  You may develop or continue to have heartburn as a result of your pregnancy.  You may develop constipation because certain hormones are causing the muscles that push waste through your intestines to slow down.  You may develop hemorrhoids or swollen, bulging veins (varicose veins).  You may have back pain. This is caused by:  Weight gain.  Pregnancy hormones that are relaxing the joints in your pelvis.  A shift in weight and the muscles that support your balance.  Your breasts will continue to grow and they will continue to become tender.  Your gums may bleed and may be sensitive to brushing and flossing.  Dark spots or blotches (chloasma, mask of pregnancy) may develop on your face. This will likely fade after the baby is born.  A dark line  from your belly button to the pubic area (linea nigra) may appear. This will likely fade after the baby is born.  You may have changes in your hair. These can include thickening of your hair, rapid growth, and changes in texture. Some women also have hair loss during or after pregnancy, or hair that feels dry or thin. Your hair will most likely return to normal after your baby is born. What to expect at prenatal visits During a routine prenatal visit:  You will be weighed to make sure you and the fetus are growing normally.  Your blood pressure will be taken.  Your abdomen will be measured to track your baby's growth.  The fetal heartbeat will be listened to.  Any test results from the previous visit will be discussed. Your health care provider may ask you:  How you are feeling.  If you are feeling the baby move.  If you have had any abnormal symptoms, such as leaking fluid, bleeding, severe headaches, or abdominal cramping.  If you are using any tobacco products, including cigarettes, chewing tobacco, and electronic cigarettes.  If you have any questions. Other tests that may be performed during your second trimester include:  Blood tests that check for:  Low iron levels (anemia).  Gestational diabetes (between 24 and 28 weeks).  Rh antibodies. This is to check for a protein on red blood cells (Rh factor).  Urine tests to check for infections, diabetes, or protein in the urine.  An ultrasound to   confirm the proper growth and development of the baby.  An amniocentesis to check for possible genetic problems.  Fetal screens for spina bifida and Down syndrome.  HIV (human immunodeficiency virus) testing. Routine prenatal testing includes screening for HIV, unless you choose not to have this test. Follow these instructions at home: Eating and drinking  Continue to eat regular, healthy meals.  Avoid raw meat, uncooked cheese, cat litter boxes, and soil used by cats. These  carry germs that can cause birth defects in the baby.  Take your prenatal vitamins.  Take 1500-2000 mg of calcium daily starting at the 20th week of pregnancy until you deliver your baby.  If you develop constipation:  Take over-the-counter or prescription medicines.  Drink enough fluid to keep your urine clear or pale yellow.  Eat foods that are high in fiber, such as fresh fruits and vegetables, whole grains, and beans.  Limit foods that are high in fat and processed sugars, such as fried and sweet foods. Activity  Exercise only as directed by your health care provider. Experiencing uterine cramps is a good sign to stop exercising.  Avoid heavy lifting, wear low heel shoes, and practice good posture.  Wear your seat belt at all times when driving.  Rest with your legs elevated if you have leg cramps or low back pain.  Wear a good support bra for breast tenderness.  Do not use hot tubs, steam rooms, or saunas. Lifestyle  Avoid all smoking, herbs, alcohol, and unprescribed drugs. These chemicals affect the formation and growth of the baby.  Do not use any products that contain nicotine or tobacco, such as cigarettes and e-cigarettes. If you need help quitting, ask your health care provider.  A sexual relationship may be continued unless your health care provider directs you otherwise. General instructions  Follow your health care provider's instructions regarding medicine use. There are medicines that are either safe or unsafe to take during pregnancy.  Take warm sitz baths to soothe any pain or discomfort caused by hemorrhoids. Use hemorrhoid cream if your health care provider approves.  If you develop varicose veins, wear support hose. Elevate your feet for 15 minutes, 3-4 times a day. Limit salt in your diet.  Visit your dentist if you have not gone yet during your pregnancy. Use a soft toothbrush to brush your teeth and be gentle when you floss.  Keep all follow-up  prenatal visits as told by your health care provider. This is important. Contact a health care provider if:  You have dizziness.  You have mild pelvic cramps, pelvic pressure, or nagging pain in the abdominal area.  You have persistent nausea, vomiting, or diarrhea.  You have a bad smelling vaginal discharge.  You have pain with urination. Get help right away if:  You have a fever.  You are leaking fluid from your vagina.  You have spotting or bleeding from your vagina.  You have severe abdominal cramping or pain.  You have rapid weight gain or weight loss.  You have shortness of breath with chest pain.  You notice sudden or extreme swelling of your face, hands, ankles, feet, or legs.  You have not felt your baby move in over an hour.  You have severe headaches that do not go away with medicine.  You have vision changes. Summary  The second trimester is from week 13 through week 28 (months 4 through 6). It is also a time when the fetus is growing rapidly.  Your body goes   through many changes during pregnancy. The changes vary from woman to woman.  Avoid all smoking, herbs, alcohol, and unprescribed drugs. These chemicals affect the formation and growth your baby.  Do not use any tobacco products, such as cigarettes, chewing tobacco, and e-cigarettes. If you need help quitting, ask your health care provider.  Contact your health care provider if you have any questions. Keep all prenatal visits as told by your health care provider. This is important. This information is not intended to replace advice given to you by your health care provider. Make sure you discuss any questions you have with your health care provider. Document Released: 04/29/2001 Document Revised: 10/11/2015 Document Reviewed: 07/06/2012 Elsevier Interactive Patient Education  2017 [employer]  

## 2016-05-05 NOTE — Progress Notes (Addendum)
Subjective:   Sabrina Sanchez is a 25 y.o. G4P3003 at 1044w0d by today's ultrasound being seen today for her first obstetrical visit.  Her obstetrical history is significant for three previous SVD. Patient does intend to breast feed. Pregnancy history fully reviewed.  Patient reports rare nausea and vomiting.  HISTORY: Obstetric History   G4   P3   T3   P0   A0   L3    SAB0   TAB0   Ectopic0   Multiple0   Live Births3     # Outcome Date GA Lbr Len/2nd Weight Sex Delivery Anes PTL Lv  4 Current           3 Term 11/12/14 7762w6d 28:45 / 00:09 8 lb 4.5 oz (3.756 kg) M Vag-Spont Local  LIV     Apgar1:  7                Apgar5: 9  2 Term 03/02/13 4094w4d 05:50 / 00:13 7 lb 13.6 oz (3.561 kg) F Vag-Spont Local  LIV     Name: Mabey,GIRL Nanette     Apgar1:  9                Apgar5: 9  1 Term 04/29/11 4047w6d 11:45 / 00:34 7 lb 14.6 oz (3.589 kg) F Vag-Spont None  LIV     Name: Vandenbos,GIRL Telisha     Apgar1:  9                Apgar5: 9     Past Medical History:  Diagnosis Date  . Fracture of wrist AGE 5 AND AGE 66  . Infection    UTI X 1  . Infection    OCC YEAST   Past Surgical History:  Procedure Laterality Date  . EYE SURGERY  AGE 41   Family History  Problem Relation Age of Onset  . Asthma Maternal Aunt   . Hyperlipidemia Maternal Grandmother   . Thrombophlebitis Maternal Grandmother   . Anesthesia problems Neg Hx    Social History  Substance Use Topics  . Smoking status: Never Smoker  . Smokeless tobacco: Never Used  . Alcohol use No   No Known Allergies Current Outpatient Prescriptions on File Prior to Visit  Medication Sig Dispense Refill  . Prenatal Vit-Fe Fumarate-FA (PRENATAL MULTIVITAMIN) TABS tablet Take 1 tablet by mouth daily at 12 noon.     No current facility-administered medications on file prior to visit.      Exam   Vitals:   05/05/16 1524  BP: 111/76  Pulse: 84  Weight: 165 lb (74.8 kg)   Fetal Heart Rate (bpm): +/+/+  Bedside scan showed triplets, BPD  measurement of two fetuses showed GA ~14 weeks  Uterus:  Fundal Height: 20 cm  Pelvic Exam: Deferred   System: General: well-developed, well-nourished female in no acute distress   Breasts:  normal appearance, no masses or tenderness   Skin: normal coloration and turgor, no rashes   Neurologic: oriented, normal, negative, normal mood   Extremities: normal strength, tone, and muscle mass, ROM of all joints is normal   HEENT PERRLA, extraocular movement intact and sclera clear, anicteric   Mouth/Teeth mucous membranes moist, pharynx normal without lesions and dental hygiene good   Neck supple and no masses   Cardiovascular: regular rate and rhythm   Respiratory:  no respiratory distress, normal breath sounds   Abdomen: soft, non-tender; bowel sounds normal; no masses,  no organomegaly  Assessment:   Pregnancy: [id number] Patient Active Problem List   Diagnosis Date Noted  . Triplet gestation 05/05/2016  . Supervision of high-risk pregnancy 05/05/2016     Plan:  1. Triplet gestation in second trimester, unspecified multiple gestation type 2. Supervision of high risk pregnancy in second trimester 3. Encounter for fetal anatomic survey Ultrasound ordered at MFM to determine chorionicity and amnionicity; and subsequent anatomy scans.   - US MFM OB DETAIL +14 WK; Future - US MFM OB DETAIL ADDL GEST +14 WK; Future - US MFM OB DETAIL ADDL GEST +14 WK; Future - US MFM OB COMP + 14 WK; Future - US MFM OB COMP ADDL GEST + 14 WK; Future - US MFM OB COMP ADDL GEST + 14 WK; Future - Hemoglobinopathy Evaluation - Prenatal Profile - GC[facility]Chlamydia probe amp [facility] - [facility]mt, Profile 6 Conf w/o mM, U - Culture, OB Urine  Initial labs drawn. Continue prenatal vitamins. Ultrasound discussed; fetal anatomic survey: ordered Problem list reviewed and updated. The nature [facility] with multiple MDs and other Advanced Practice Providers  was explained to patient; also emphasized that residents, students are part of our team. Routine obstetric precautions reviewed. Return in about 4 weeks (around 06/02/2016) for OB Visit.     Jaynie CollinsUGONNA  Meiko Ives, MD, FACOG Attending Obstetrician & Gynecologist, [facility], [facility]

## 2016-05-05 NOTE — Addendum Note (Signed)
Addended by: Gita KudoLASSITER, Bennette Hasty S on: 05/05/2016 04:45 PM   Modules accepted: Orders

## 2016-05-06 LAB — PAIN MGMT, PROFILE 6 CONF W/O MM, U
6 Acetylmorphine: NEGATIVE ng/mL (ref <10)
ALCOHOL METABOLITES: NEGATIVE ng/mL (ref <500)
Amphetamines: NEGATIVE ng/mL (ref <500)
Barbiturates: NEGATIVE ng/mL (ref <300)
Benzodiazepines: NEGATIVE ng/mL (ref <100)
Cocaine Metabolite: NEGATIVE ng/mL (ref <150)
Creatinine: 83.2 mg/dL (ref >=20.0)
MARIJUANA METABOLITE: NEGATIVE ng/mL (ref <20)
Methadone Metabolite: NEGATIVE ng/mL (ref <100)
OPIATES: NEGATIVE ng/mL (ref <100)
OXYCODONE: NEGATIVE ng/mL (ref <100)
Oxidant: NEGATIVE ug/mL (ref <200)
PLEASE NOTE: 0
Phencyclidine: NEGATIVE ng/mL (ref <25)
pH: 6.78 (ref 4.5–9.0)

## 2016-05-06 LAB — PRENATAL PROFILE (SOLSTAS)
Antibody Screen: NEGATIVE
BASOS ABS: 0 {cells}/uL (ref 0–200)
BASOS PCT: 0 %
EOS ABS: 124 {cells}/uL (ref 15–500)
Eosinophils Relative: 2 %
HCT: 33 % — ABNORMAL LOW (ref 35.0–45.0)
HEP B S AG: NEGATIVE
HIV: NONREACTIVE
Hemoglobin: 10.5 g/dL — ABNORMAL LOW (ref 11.7–15.5)
Lymphocytes Relative: 19 %
Lymphs Abs: 1178 cells/uL (ref 850–3900)
MCH: 25.1 pg — ABNORMAL LOW (ref 27.0–33.0)
MCHC: 31.8 g/dL — AB (ref 32.0–36.0)
MCV: 78.9 fL — AB (ref 80.0–100.0)
MONO ABS: 496 {cells}/uL (ref 200–950)
MONOS PCT: 8 %
MPV: 10.1 fL (ref 7.5–12.5)
NEUTROS ABS: 4402 {cells}/uL (ref 1500–7800)
Neutrophils Relative %: 71 %
Platelets: 195 10*3/uL (ref 140–400)
RBC: 4.18 MIL/uL (ref 3.80–5.10)
RDW: 15.9 % — AB (ref 11.0–15.0)
RUBELLA: 2.32 {index} — AB (ref <0.90)
Rh Type: POSITIVE
WBC: 6.2 10*3/uL (ref 3.8–10.8)

## 2016-05-07 LAB — HEMOGLOBINOPATHY EVALUATION
HEMATOCRIT: 33 % — AB (ref 35.0–45.0)
Hemoglobin: 10.5 g/dL — ABNORMAL LOW (ref 11.7–15.5)
Hgb A2 Quant: 2.4 % (ref 1.8–3.5)
Hgb A: 96.6 % (ref >96.0)
Hgb F Quant: <1 % (ref <2.0)
MCH: 25.1 pg — AB (ref 27.0–33.0)
MCV: 78.9 fL — AB (ref 80.0–100.0)
RBC: 4.18 MIL/uL (ref 3.80–5.10)
RDW: 15.9 % — ABNORMAL HIGH (ref 11.0–15.0)

## 2016-05-07 LAB — CULTURE, OB URINE

## 2016-05-07 LAB — GC/CHLAMYDIA PROBE AMP (~~LOC~~) NOT AT ARMC
CHLAMYDIA, DNA PROBE: NEGATIVE
NEISSERIA GONORRHEA: NEGATIVE

## 2016-05-14 ENCOUNTER — Ambulatory Visit (HOSPITAL_COMMUNITY)
Admission: RE | Admit: 2016-05-14 | Discharge: 2016-05-14 | Disposition: A | Discharge status: Home / Self Care | Payer: BLUE CROSS/BLUE SHIELD | Source: Ambulatory Visit | Attending: Obstetrics & Gynecology | Admitting: Obstetrics & Gynecology

## 2016-05-14 ENCOUNTER — Encounter (HOSPITAL_COMMUNITY): Payer: Self-pay

## 2016-05-14 DIAGNOSIS — O30102 Triplet pregnancy, unspecified number of placenta and unspecified number of amniotic sacs, second trimester: Secondary | ICD-10-CM | POA: Diagnosis present

## 2016-05-14 DIAGNOSIS — Z3A15 15 weeks gestation of pregnancy: Secondary | ICD-10-CM | POA: Insufficient documentation

## 2016-05-20 ENCOUNTER — Ambulatory Visit (HOSPITAL_COMMUNITY): Payer: BLUE CROSS/BLUE SHIELD

## 2016-06-03 ENCOUNTER — Ambulatory Visit (INDEPENDENT_AMBULATORY_CARE_PROVIDER_SITE_OTHER): Payer: BLUE CROSS/BLUE SHIELD | Admitting: Family Medicine

## 2016-06-03 VITALS — BP 111/70 | HR 102 | Wt 173.0 lb

## 2016-06-03 DIAGNOSIS — O30101 Triplet pregnancy, unspecified number of placenta and unspecified number of amniotic sacs, first trimester: Secondary | ICD-10-CM

## 2016-06-03 DIAGNOSIS — O30109 Triplet pregnancy, unspecified number of placenta and unspecified number of amniotic sacs, unspecified trimester: Secondary | ICD-10-CM

## 2016-06-03 DIAGNOSIS — O0992 Supervision of high risk pregnancy, unspecified, second trimester: Secondary | ICD-10-CM

## 2016-06-03 DIAGNOSIS — O0941 Supervision of pregnancy with grand multiparity, first trimester: Secondary | ICD-10-CM | POA: Diagnosis not present

## 2016-06-03 NOTE — Progress Notes (Signed)
   PRENATAL VISIT NOTE  Subjective:  Sabrina Sanchez is a 26 y.o. G4P3003 at 7975w1d being seen today for ongoing prenatal care.  She is currently monitored for the following issues for this high-risk pregnancy and has Triplet gestation and Supervision of high-risk pregnancy on her problem list.  Patient reports no complaints.  Contractions: Not present. Vag. Bleeding: None.  Movement: Present. Denies leaking of fluid.   The following portions of the patient's history were reviewed and updated as appropriate: allergies, current medications, past family history, past medical history, past social history, past surgical history and problem list. Problem list updated.  Objective:   Vitals:   06/03/16 0915  BP: 111/70  Pulse: (!) 102  Weight: 173 lb (78.5 kg)    Fetal Status: Fetal Heart Rate (bpm): 143/146/146   Movement: Present     General:  Alert, oriented and cooperative. Patient is in no acute distress.  Skin: Skin is warm and dry. No rash noted.   Cardiovascular: Normal heart rate noted  Respiratory: Normal respiratory effort, no problems with respiration noted  Abdomen: Soft, gravid, appropriate for gestational age. Pain/Pressure: Present     Pelvic:  Cervical exam deferred        Extremities: Normal range of motion.  Edema: None  Mental Status: Normal mood and affect. Normal behavior. Normal judgment and thought content.   Assessment and Plan:  Pregnancy: G4P3003 at 8775w1d  1. Supervision of high risk pregnancy in second trimester For anatomy x 3 tomorrow  2. Triplet gestation Patient strongly desires attempt at vaginal delivery if possible. Feeling more tired and some low abdominal pain. Cervix was 3.0 cm last check  Preterm labor symptoms and general obstetric precautions including but not limited to vaginal bleeding, contractions, leaking of fluid and fetal movement were reviewed in detail with the patient. Please refer to After Visit Summary for other counseling  recommendations.  Return in 3 weeks (on 06/24/2016).   Reva Boresanya S Amaris Garrette, MD

## 2016-06-03 NOTE — Patient Instructions (Signed)
Second Trimester of Pregnancy The second trimester is from week 13 through week 28 (months 4 through 6). The second trimester is often a time when you feel your best. Your body has also adjusted to being pregnant, and you begin to feel better physically. Usually, morning sickness has lessened or quit completely, you may have more energy, and you may have an increase in appetite. The second trimester is also a time when the fetus is growing rapidly. At the end of the sixth month, the fetus is about 9 inches long and weighs about 1 pounds. You will likely begin to feel the baby move (quickening) between 18 and 20 weeks of the pregnancy. Body changes during your second trimester Your body continues to go through many changes during your second trimester. The changes vary from woman to woman.  Your weight will continue to increase. You will notice your lower abdomen bulging out.  You may begin to get stretch marks on your hips, abdomen, and breasts.  You may develop headaches that can be relieved by medicines. The medicines should be approved by your health care provider.  You may urinate more often because the fetus is pressing on your bladder.  You may develop or continue to have heartburn as a result of your pregnancy.  You may develop constipation because certain hormones are causing the muscles that push waste through your intestines to slow down.  You may develop hemorrhoids or swollen, bulging veins (varicose veins).  You may have back pain. This is caused by:  Weight gain.  Pregnancy hormones that are relaxing the joints in your pelvis.  A shift in weight and the muscles that support your balance.  Your breasts will continue to grow and they will continue to become tender.  Your gums may bleed and may be sensitive to brushing and flossing.  Dark spots or blotches (chloasma, mask of pregnancy) may develop on your face. This will likely fade after the baby is born.  A dark line  from your belly button to the pubic area (linea nigra) may appear. This will likely fade after the baby is born.  You may have changes in your hair. These can include thickening of your hair, rapid growth, and changes in texture. Some women also have hair loss during or after pregnancy, or hair that feels dry or thin. Your hair will most likely return to normal after your baby is born. What to expect at prenatal visits During a routine prenatal visit:  You will be weighed to make sure you and the fetus are growing normally.  Your blood pressure will be taken.  Your abdomen will be measured to track your baby's growth.  The fetal heartbeat will be listened to.  Any test results from the previous visit will be discussed. Your health care provider may ask you:  How you are feeling.  If you are feeling the baby move.  If you have had any abnormal symptoms, such as leaking fluid, bleeding, severe headaches, or abdominal cramping.  If you are using any tobacco products, including cigarettes, chewing tobacco, and electronic cigarettes.  If you have any questions. Other tests that may be performed during your second trimester include:  Blood tests that check for:  Low iron levels (anemia).  Gestational diabetes (between 24 and 28 weeks).  Rh antibodies. This is to check for a protein on red blood cells (Rh factor).  Urine tests to check for infections, diabetes, or protein in the urine.  An ultrasound to   confirm the proper growth and development of the baby.  An amniocentesis to check for possible genetic problems.  Fetal screens for spina bifida and Down syndrome.  HIV (human immunodeficiency virus) testing. Routine prenatal testing includes screening for HIV, unless you choose not to have this test. Follow these instructions at home: Eating and drinking  Continue to eat regular, healthy meals.  Avoid raw meat, uncooked cheese, cat litter boxes, and soil used by cats. These  carry germs that can cause birth defects in the baby.  Take your prenatal vitamins.  Take 1500-2000 mg of calcium daily starting at the 20th week of pregnancy until you deliver your baby.  If you develop constipation:  Take over-the-counter or prescription medicines.  Drink enough fluid to keep your urine clear or pale yellow.  Eat foods that are high in fiber, such as fresh fruits and vegetables, whole grains, and beans.  Limit foods that are high in fat and processed sugars, such as fried and sweet foods. Activity  Exercise only as directed by your health care provider. Experiencing uterine cramps is a good sign to stop exercising.  Avoid heavy lifting, wear low heel shoes, and practice good posture.  Wear your seat belt at all times when driving.  Rest with your legs elevated if you have leg cramps or low back pain.  Wear a good support bra for breast tenderness.  Do not use hot tubs, steam rooms, or saunas. Lifestyle  Avoid all smoking, herbs, alcohol, and unprescribed drugs. These chemicals affect the formation and growth of the baby.  Do not use any products that contain nicotine or tobacco, such as cigarettes and e-cigarettes. If you need help quitting, ask your health care provider.  A sexual relationship may be continued unless your health care provider directs you otherwise. General instructions  Follow your health care provider's instructions regarding medicine use. There are medicines that are either safe or unsafe to take during pregnancy.  Take warm sitz baths to soothe any pain or discomfort caused by hemorrhoids. Use hemorrhoid cream if your health care provider approves.  If you develop varicose veins, wear support hose. Elevate your feet for 15 minutes, 3-4 times a day. Limit salt in your diet.  Visit your dentist if you have not gone yet during your pregnancy. Use a soft toothbrush to brush your teeth and be gentle when you floss.  Keep all follow-up  prenatal visits as told by your health care provider. This is important. Contact a health care provider if:  You have dizziness.  You have mild pelvic cramps, pelvic pressure, or nagging pain in the abdominal area.  You have persistent nausea, vomiting, or diarrhea.  You have a bad smelling vaginal discharge.  You have pain with urination. Get help right away if:  You have a fever.  You are leaking fluid from your vagina.  You have spotting or bleeding from your vagina.  You have severe abdominal cramping or pain.  You have rapid weight gain or weight loss.  You have shortness of breath with chest pain.  You notice sudden or extreme swelling of your face, hands, ankles, feet, or legs.  You have not felt your baby move in over an hour.  You have severe headaches that do not go away with medicine.  You have vision changes. Summary  The second trimester is from week 13 through week 28 (months 4 through 6). It is also a time when the fetus is growing rapidly.  Your body goes   through many changes during pregnancy. The changes vary from woman to woman.  Avoid all smoking, herbs, alcohol, and unprescribed drugs. These chemicals affect the formation and growth your baby.  Do not use any tobacco products, such as cigarettes, chewing tobacco, and e-cigarettes. If you need help quitting, ask your health care provider.  Contact your health care provider if you have any questions. Keep all prenatal visits as told by your health care provider. This is important. This information is not intended to replace advice given to you by your health care provider. Make sure you discuss any questions you have with your health care provider. Document Released: 04/29/2001 Document Revised: 10/11/2015 Document Reviewed: 07/06/2012 [employer] Interactive Patient Education  2017 [employer]   Breastfeeding Deciding to breastfeed is one of the best choices you can make for you and your baby. A  change in hormones during pregnancy causes your breast tissue to grow and increases the number and size of your milk ducts. These hormones also allow proteins, sugars, and fats from your blood supply to make breast milk in your milk-producing glands. Hormones prevent breast milk from being released before your baby is born as well as prompt milk flow after birth. Once breastfeeding has begun, thoughts of your baby, as well as his or her sucking or crying, can stimulate the release of milk from your milk-producing glands. Benefits of breastfeeding For Your Baby  Your first milk (colostrum) helps your baby's digestive system function better.  There are antibodies in your milk that help your baby fight off infections.  Your baby has a lower incidence of asthma, allergies, and sudden infant death syndrome.  The nutrients in breast milk are better for your baby than infant formulas and are designed uniquely for your baby's needs.  Breast milk improves your baby's brain development.  Your baby is less likely to develop other conditions, such as childhood obesity, asthma, or type 2 diabetes mellitus. For You  Breastfeeding helps to create a very special bond between you and your baby.  Breastfeeding is convenient. Breast milk is always available at the correct temperature and costs nothing.  Breastfeeding helps to burn calories and helps you lose the weight gained during pregnancy.  Breastfeeding makes your uterus contract to its prepregnancy size faster and slows bleeding (lochia) after you give birth.  Breastfeeding helps to lower your risk of developing type 2 diabetes mellitus, osteoporosis, and breast or ovarian cancer later in life. Signs that your baby is hungry Early Signs of Hunger  Increased alertness or activity.  Stretching.  Movement of the head from side to side.  Movement of the head and opening of the mouth when the corner of the mouth or cheek is stroked  (rooting).  Increased sucking sounds, smacking lips, cooing, sighing, or squeaking.  Hand-to-mouth movements.  Increased sucking of fingers or hands. Late Signs of Hunger  Fussing.  Intermittent crying. Extreme Signs of Hunger  Signs of extreme hunger will require calming and consoling before your baby will be able to breastfeed successfully. Do not wait for the following signs of extreme hunger to occur before you initiate breastfeeding:  Restlessness.  A loud, strong cry.  Screaming. Breastfeeding basics  Breastfeeding Initiation  Find a comfortable place to sit or lie down, with your neck and back well supported.  Place a pillow or rolled up blanket under your baby to bring him or her to the level of your breast (if you are seated). Nursing pillows are specially designed to help   support your arms and your baby while you breastfeed.  Make sure that your baby's abdomen is facing your abdomen.  Gently massage your breast. With your fingertips, massage from your chest wall toward your nipple in a circular motion. This encourages milk flow. You may need to continue this action during the feeding if your milk flows slowly.  Support your breast with 4 fingers underneath and your thumb above your nipple. Make sure your fingers are well away from your nipple and your baby's mouth.  Stroke your baby's lips gently with your finger or nipple.  When your baby's mouth is open wide enough, quickly bring your baby to your breast, placing your entire nipple and as much of the colored area around your nipple (areola) as possible into your baby's mouth.  More areola should be visible above your baby's upper lip than below the lower lip.  Your baby's tongue should be between his or her lower gum and your breast.  Ensure that your baby's mouth is correctly positioned around your nipple (latched). Your baby's lips should create a seal on your breast and be turned out (everted).  It is common  for your baby to suck about 2-3 minutes in order to start the flow of breast milk. Latching  Teaching your baby how to latch on to your breast properly is very important. An improper latch can cause nipple pain and decreased milk supply for you and poor weight gain in your baby. Also, if your baby is not latched onto your nipple properly, he or she may swallow some air during feeding. This can make your baby fussy. Burping your baby when you switch breasts during the feeding can help to get rid of the air. However, teaching your baby to latch on properly is still the best way to prevent fussiness from swallowing air while breastfeeding. Signs that your baby has successfully latched on to your nipple:  Silent tugging or silent sucking, without causing you pain.  Swallowing heard between every 3-4 sucks.  Muscle movement above and in front of his or her ears while sucking. Signs that your baby has not successfully latched on to nipple:  Sucking sounds or smacking sounds from your baby while breastfeeding.  Nipple pain. If you think your baby has not latched on correctly, slip your finger into the corner of your baby's mouth to break the suction and place it between your baby's gums. Attempt breastfeeding initiation again. Signs of Successful Breastfeeding  Signs from your baby:  A gradual decrease in the number of sucks or complete cessation of sucking.  Falling asleep.  Relaxation of his or her body.  Retention of a small amount of milk in his or her mouth.  Letting go of your breast by himself or herself. Signs from you:  Breasts that have increased in firmness, weight, and size 1-3 hours after feeding.  Breasts that are softer immediately after breastfeeding.  Increased milk volume, as well as a change in milk consistency and color by the fifth day of breastfeeding.  Nipples that are not sore, cracked, or bleeding. Signs That Your Baby is Getting Enough Milk  Wetting at least  1-2 diapers during the first 24 hours after birth.  Wetting at least 5-6 diapers every 24 hours for the first week after birth. The urine should be clear or pale yellow by 5 days after birth.  Wetting 6-8 diapers every 24 hours as your baby continues to grow and develop.  At least 3 stools in   a 24-hour period by age 5 days. The stool should be soft and yellow.  At least 3 stools in a 24-hour period by age 7 days. The stool should be seedy and yellow.  No loss of weight greater than 10% of birth weight during the first 3 days of age.  Average weight gain of 4-7 ounces (113-198 g) per week after age 4 days.  Consistent daily weight gain by age 5 days, without weight loss after the age of 2 weeks. After a feeding, your baby may spit up a small amount. This is common. Breastfeeding frequency and duration Frequent feeding will help you make more milk and can prevent sore nipples and breast engorgement. Breastfeed when you feel the need to reduce the fullness of your breasts or when your baby shows signs of hunger. This is called "breastfeeding on demand." Avoid introducing a pacifier to your baby while you are working to establish breastfeeding (the first 4-6 weeks after your baby is born). After this time you may choose to use a pacifier. Research has shown that pacifier use during the first year of a baby's life decreases the risk of sudden infant death syndrome (SIDS). Allow your baby to feed on each breast as long as he or she wants. Breastfeed until your baby is finished feeding. When your baby unlatches or falls asleep while feeding from the first breast, offer the second breast. Because newborns are often sleepy in the first few weeks of life, you may need to awaken your baby to get him or her to feed. Breastfeeding times will vary from baby to baby. However, the following rules can serve as a guide to help you ensure that your baby is properly fed:  Newborns (babies 4 weeks of age or younger)  may breastfeed every 1-3 hours.  Newborns should not go longer than 3 hours during the day or 5 hours during the night without breastfeeding.  You should breastfeed your baby a minimum of 8 times in a 24-hour period until you begin to introduce solid foods to your baby at around 6 months of age. Breast milk pumping Pumping and storing breast milk allows you to ensure that your baby is exclusively fed your breast milk, even at times when you are unable to breastfeed. This is especially important if you are going back to work while you are still breastfeeding or when you are not able to be present during feedings. Your lactation consultant can give you guidelines on how long it is safe to store breast milk. A breast pump is a machine that allows you to pump milk from your breast into a sterile bottle. The pumped breast milk can then be stored in a refrigerator or freezer. Some breast pumps are operated by hand, while others use electricity. Ask your lactation consultant which type will work best for you. Breast pumps can be purchased, but some hospitals and breastfeeding support groups lease breast pumps on a monthly basis. A lactation consultant can teach you how to hand express breast milk, if you prefer not to use a pump. Caring for your breasts while you breastfeed Nipples can become dry, cracked, and sore while breastfeeding. The following recommendations can help keep your breasts moisturized and healthy:  Avoid using soap on your nipples.  Wear a supportive bra. Although not required, special nursing bras and tank tops are designed to allow access to your breasts for breastfeeding without taking off your entire bra or top. Avoid wearing underwire-style bras or extremely tight   bras.  Air dry your nipples for 3-4minutes after each feeding.  Use only cotton bra pads to absorb leaked breast milk. Leaking of breast milk between feedings is normal.  Use lanolin on your nipples after breastfeeding.  Lanolin helps to maintain your skin's normal moisture barrier. If you use pure lanolin, you do not need to wash it off before feeding your baby again. Pure lanolin is not toxic to your baby. You may also hand express a few drops of breast milk and gently massage that milk into your nipples and allow the milk to air dry. In the first few weeks after giving birth, some women experience extremely full breasts (engorgement). Engorgement can make your breasts feel heavy, warm, and tender to the touch. Engorgement peaks within 3-5 days after you give birth. The following recommendations can help ease engorgement:  Completely empty your breasts while breastfeeding or pumping. You may want to start by applying warm, moist heat (in the shower or with warm water-soaked hand towels) just before feeding or pumping. This increases circulation and helps the milk flow. If your baby does not completely empty your breasts while breastfeeding, pump any extra milk after he or she is finished.  Wear a snug bra (nursing or regular) or tank top for 1-2 days to signal your body to slightly decrease milk production.  Apply ice packs to your breasts, unless this is too uncomfortable for you.  Make sure that your baby is latched on and positioned properly while breastfeeding. If engorgement persists after 48 hours of following these recommendations, contact your health care provider or a lactation consultant. Overall health care recommendations while breastfeeding  Eat healthy foods. Alternate between meals and snacks, eating 3 of each per day. Because what you eat affects your breast milk, some of the foods may make your baby more irritable than usual. Avoid eating these foods if you are sure that they are negatively affecting your baby.  Drink milk, fruit juice, and water to satisfy your thirst (about 10 glasses a day).  Rest often, relax, and continue to take your prenatal vitamins to prevent fatigue, stress, and  anemia.  Continue breast self-awareness checks.  Avoid chewing and smoking tobacco. Chemicals from cigarettes that pass into breast milk and exposure to secondhand smoke may harm your baby.  Avoid alcohol and drug use, including marijuana. Some medicines that may be harmful to your baby can pass through breast milk. It is important to ask your health care provider before taking any medicine, including all over-the-counter and prescription medicine as well as vitamin and herbal supplements. It is possible to become pregnant while breastfeeding. If birth control is desired, ask your health care provider about options that will be safe for your baby. Contact a health care provider if:  You feel like you want to stop breastfeeding or have become frustrated with breastfeeding.  You have painful breasts or nipples.  Your nipples are cracked or bleeding.  Your breasts are red, tender, or warm.  You have a swollen area on either breast.  You have a fever or chills.  You have nausea or vomiting.  You have drainage other than breast milk from your nipples.  Your breasts do not become full before feedings by the fifth day after you give birth.  You feel sad and depressed.  Your baby is too sleepy to eat well.  Your baby is having trouble sleeping.  Your baby is wetting less than 3 diapers in a 24-hour period.  Your baby   has less than 3 stools in a 24-hour period.  Your baby's skin or the white part of his or her eyes becomes yellow.  Your baby is not gaining weight by 5 days of age. Get help right away if:  Your baby is overly tired (lethargic) and does not want to wake up and feed.  Your baby develops an unexplained fever. This information is not intended to replace advice given to you by your health care provider. Make sure you discuss any questions you have with your health care provider. Document Released: 05/05/2005 Document Revised: 10/17/2015 Document Reviewed:  10/27/2012 [employer] Interactive Patient Education  2017 [employer]  

## 2016-06-04 ENCOUNTER — Ambulatory Visit (HOSPITAL_COMMUNITY): Admission: RE | Admit: 2016-06-04 | Payer: BLUE CROSS/BLUE SHIELD | Source: Ambulatory Visit

## 2016-06-13 ENCOUNTER — Encounter (HOSPITAL_COMMUNITY): Payer: Self-pay

## 2016-06-13 ENCOUNTER — Ambulatory Visit (HOSPITAL_COMMUNITY)
Admission: RE | Admit: 2016-06-13 | Discharge: 2016-06-13 | Disposition: A | Discharge status: Home / Self Care | Payer: BLUE CROSS/BLUE SHIELD | Source: Ambulatory Visit | Attending: Obstetrics & Gynecology | Admitting: Obstetrics & Gynecology

## 2016-06-13 DIAGNOSIS — O321XX2 Maternal care for breech presentation, fetus 2: Secondary | ICD-10-CM | POA: Diagnosis not present

## 2016-06-13 DIAGNOSIS — Z3A19 19 weeks gestation of pregnancy: Secondary | ICD-10-CM | POA: Insufficient documentation

## 2016-06-13 DIAGNOSIS — O30102 Triplet pregnancy, unspecified number of placenta and unspecified number of amniotic sacs, second trimester: Secondary | ICD-10-CM | POA: Diagnosis not present

## 2016-06-13 DIAGNOSIS — Z3689 Encounter for other specified antenatal screening: Secondary | ICD-10-CM

## 2016-06-13 DIAGNOSIS — O0992 Supervision of high risk pregnancy, unspecified, second trimester: Secondary | ICD-10-CM

## 2016-06-16 ENCOUNTER — Other Ambulatory Visit (HOSPITAL_COMMUNITY): Payer: Self-pay | Admitting: *Deleted

## 2016-06-16 DIAGNOSIS — O30102 Triplet pregnancy, unspecified number of placenta and unspecified number of amniotic sacs, second trimester: Secondary | ICD-10-CM

## 2016-06-24 ENCOUNTER — Ambulatory Visit (INDEPENDENT_AMBULATORY_CARE_PROVIDER_SITE_OTHER): Payer: BLUE CROSS/BLUE SHIELD | Admitting: Obstetrics & Gynecology

## 2016-06-24 ENCOUNTER — Other Ambulatory Visit (HOSPITAL_COMMUNITY)
Admission: RE | Admit: 2016-06-24 | Discharge: 2016-06-24 | Disposition: A | Discharge status: Home / Self Care | Payer: BLUE CROSS/BLUE SHIELD | Source: Ambulatory Visit | Attending: Obstetrics & Gynecology | Admitting: Obstetrics & Gynecology

## 2016-06-24 VITALS — BP 112/69 | HR 102 | Wt 183.0 lb

## 2016-06-24 DIAGNOSIS — Z3A21 21 weeks gestation of pregnancy: Secondary | ICD-10-CM | POA: Diagnosis not present

## 2016-06-24 DIAGNOSIS — O26892 Other specified pregnancy related conditions, second trimester: Secondary | ICD-10-CM

## 2016-06-24 DIAGNOSIS — K4090 Unilateral inguinal hernia, without obstruction or gangrene, not specified as recurrent: Secondary | ICD-10-CM | POA: Insufficient documentation

## 2016-06-24 DIAGNOSIS — Z3689 Encounter for other specified antenatal screening: Secondary | ICD-10-CM

## 2016-06-24 DIAGNOSIS — Z113 Encounter for screening for infections with a predominantly sexual mode of transmission: Secondary | ICD-10-CM | POA: Diagnosis not present

## 2016-06-24 DIAGNOSIS — N898 Other specified noninflammatory disorders of vagina: Secondary | ICD-10-CM | POA: Insufficient documentation

## 2016-06-24 DIAGNOSIS — O0992 Supervision of high risk pregnancy, unspecified, second trimester: Secondary | ICD-10-CM

## 2016-06-24 DIAGNOSIS — O30102 Triplet pregnancy, unspecified number of placenta and unspecified number of amniotic sacs, second trimester: Secondary | ICD-10-CM

## 2016-06-24 NOTE — Patient Instructions (Addendum)
Return to clinic for any scheduled appointments or obstetric concerns, or go to [facility]U for evaluation    Inguinal Hernia, Adult Introduction An inguinal hernia is when fat or the intestines push through the area where the leg meets the lower abdomen (groin) and create a rounded lump (bulge). This condition develops over time. There are three types of inguinal hernias. These types include:  Hernias that can be pushed back into the belly (are reducible).  Hernias that are not reducible (are incarcerated).  Hernias that are not reducible and lose their blood supply (are strangulated). This type of hernia requires emergency surgery. What are the causes? This condition is caused by having a weak spot in the muscles or tissue. This weakness lets the hernia poke through. This condition can be triggered by:  Suddenly straining the muscles of the lower abdomen.  Lifting heavy objects.  Straining to have a bowel movement. Difficult bowel movements (constipation) can lead to this.  Coughing. What increases the risk? This condition is more likely to develop in:  Men.  Pregnant women, especially in multiple gestation.  People who:  Are overweight.  Work in jobs that require long periods of standing or heavy lifting.  Have had an inguinal hernia before.  Smoke or have lung disease. These factors can lead to long-lasting (chronic) coughing. What are the signs or symptoms? Symptoms can depend on the size of the hernia. Often, a small inguinal hernia has no symptoms. Symptoms of a larger hernia include:  A lump in the groin. This is easier to see when the person is standing. It might not be visible when he or she is lying down.  Pain or burning in the groin. This occurs especially when lifting, straining, or coughing.  A dull ache or a feeling of pressure in the groin.  A lump in the scrotum in men. Symptoms of a strangulated inguinal hernia can include:  A bulge in the groin that is  very painful and tender to the touch.  A bulge that turns red or purple.  Fever, nausea, and vomiting.  The inability to have a bowel movement or to pass gas. How is this diagnosed? This condition is diagnosed with a medical history and physical exam. Your health care provider may feel your groin area and ask you to cough. How is this treated? Treatment for this condition varies depending on the size of your hernia and whether you have symptoms. If you do not have symptoms, your health care provider may have you watch your hernia carefully and come in for follow-up visits. If your hernia is larger or if you have symptoms, your treatment will include surgery. Follow these instructions at home: Lifestyle  Drink enough fluid to keep your urine clear or pale yellow.  Eat a diet that includes a lot of fiber. Eat plenty of fruits, vegetables, and whole grains. Talk with your health care provider if you have questions.  Avoid lifting heavy objects.  Avoid standing for long periods of time.  Do not use tobacco products, including cigarettes, chewing tobacco, or e-cigarettes. If you need help quitting, ask your health care provider.  Maintain a healthy weight. General instructions  Do not try to force the hernia back in.  Watch your hernia for any changes in color or size. Let your health care provider know if any changes occur.  Take over-the-counter and prescription medicines only as told by your health care provider.  Keep all follow-up visits as told by your health care  provider. This is important. Contact a health care provider if:  You have a fever.  You have new symptoms.  Your symptoms get worse. Get help right away if:  You have pain in the groin that suddenly gets worse.  A bulge in the groin gets bigger suddenly and does not go down.  You are a man and you have a sudden pain in the scrotum, or the size of your scrotum suddenly changes.  A bulge in the groin area  becomes red or purple and is painful to the touch.  You have nausea or vomiting that does not go away.  You feel your heart beating a lot more quickly than normal.  You cannot have a bowel movement or pass gas. This information is not intended to replace advice given to you by your health care provider. Make sure you discuss any questions you have with your health care provider. Document Released: 09/21/2008 Document Revised: 10/11/2015 Document Reviewed: 03/15/2014  2017 Elsevier

## 2016-06-24 NOTE — Progress Notes (Signed)
   PRENATAL VISIT NOTE  Subjective:  Sabrina Sanchez is a 26 y.o. G4P3003 at 7330w1d being seen today for ongoing prenatal care.  She is currently monitored for the following issues for this high-risk pregnancy and has Triplet gestation and Supervision of high-risk pregnancy on her problem list.  Patient reports vaginal discharge and some some swelling in left groin area  Contractions: Irritability. Vag. Bleeding: None.  Movement: Present. Denies leaking of fluid.   The following portions of the patient's history were reviewed and updated as appropriate: allergies, current medications, past family history, past medical history, past social history, past surgical history and problem list. Problem list updated.  Objective:   Vitals:   06/24/16 0942  BP: 112/69  Pulse: (!) 102  Weight: 183 lb (83 kg)    Fetal Status: Fetal Heart Rate (bpm): 146/148/152   Movement: Present     General:  Alert, oriented and cooperative. Patient is in no acute distress.  Skin: Skin is warm and dry. No rash noted.   Cardiovascular: Normal heart rate noted  Respiratory: Normal respiratory effort, no problems with respiration noted  Abdomen: Soft, gravid, appropriate for gestational age. Pain/Pressure: Present     Pelvic:  NEFG. White discharge seen, sample obtained for testing. Small (1 cm) left inguinal hernia seen, reducible, no obstruction, no erythema. Mild TTP.  Extremities: Normal range of motion.  Edema: None  Mental Status: Normal mood and affect. Normal behavior. Normal judgment and thought content.   Assessment and Plan:  Pregnancy: G4P3003 at 7330w1d  1. Triplet gestation in second trimester, unspecified multiple gestation type Followed by MFM Strongly desires vaginal delivery; she was told we will discuss this as a group and make plans to be able to provide this for her.    2. Vaginal discharge during pregnancy in second trimester - Cervicovaginal ancillary only done, will follow up results and  manage accordingly.  3. Unilateral inguinal hernia without obstruction or gangrene, recurrence not specified Reducible. Precautions advised.  4. Supervision of high risk pregnancy in second trimester Preterm labor symptoms and general obstetric precautions including but not limited to vaginal bleeding, contractions, leaking of fluid and fetal movement were reviewed in detail with the patient. Please refer to After Visit Summary for other counseling recommendations.  Return in about 4 weeks (around 07/22/2016) for OB Visit.   Tereso NewcomerUgonna A Keylon Labelle, MD

## 2016-06-24 NOTE — Addendum Note (Signed)
Addended by: Arne ClevelandHUTCHINSON, Apoorva Bugay J on: 06/24/2016 12:15 PM   Modules accepted: Orders

## 2016-06-25 LAB — CERVICOVAGINAL ANCILLARY ONLY
BACTERIAL VAGINITIS: NEGATIVE
Candida vaginitis: NEGATIVE
Trichomonas: NEGATIVE

## 2016-07-15 ENCOUNTER — Ambulatory Visit (HOSPITAL_COMMUNITY)
Admission: RE | Admit: 2016-07-15 | Discharge: 2016-07-15 | Disposition: A | Discharge status: Home / Self Care | Payer: BLUE CROSS/BLUE SHIELD | Source: Ambulatory Visit | Attending: Obstetrics & Gynecology | Admitting: Obstetrics & Gynecology

## 2016-07-15 ENCOUNTER — Other Ambulatory Visit (HOSPITAL_COMMUNITY): Payer: Self-pay | Admitting: Obstetrics and Gynecology

## 2016-07-15 ENCOUNTER — Encounter (HOSPITAL_COMMUNITY): Payer: Self-pay

## 2016-07-15 ENCOUNTER — Other Ambulatory Visit (HOSPITAL_COMMUNITY): Payer: Self-pay | Admitting: *Deleted

## 2016-07-15 DIAGNOSIS — O30102 Triplet pregnancy, unspecified number of placenta and unspecified number of amniotic sacs, second trimester: Secondary | ICD-10-CM | POA: Diagnosis not present

## 2016-07-15 DIAGNOSIS — Z3A24 24 weeks gestation of pregnancy: Secondary | ICD-10-CM | POA: Insufficient documentation

## 2016-07-15 DIAGNOSIS — O30103 Triplet pregnancy, unspecified number of placenta and unspecified number of amniotic sacs, third trimester: Secondary | ICD-10-CM

## 2016-07-15 DIAGNOSIS — O30109 Triplet pregnancy, unspecified number of placenta and unspecified number of amniotic sacs, unspecified trimester: Secondary | ICD-10-CM

## 2016-07-15 DIAGNOSIS — Z362 Encounter for other antenatal screening follow-up: Secondary | ICD-10-CM | POA: Diagnosis not present

## 2016-07-18 ENCOUNTER — Inpatient Hospital Stay (HOSPITAL_COMMUNITY)
Admission: AD | Admit: 2016-07-18 | Discharge: 2016-07-19 | Disposition: A | Discharge status: Home / Self Care | Payer: BLUE CROSS/BLUE SHIELD | Source: Ambulatory Visit | Attending: Obstetrics and Gynecology | Admitting: Obstetrics and Gynecology

## 2016-07-18 DIAGNOSIS — E860 Dehydration: Secondary | ICD-10-CM | POA: Diagnosis not present

## 2016-07-18 DIAGNOSIS — R109 Unspecified abdominal pain: Secondary | ICD-10-CM | POA: Diagnosis not present

## 2016-07-18 DIAGNOSIS — O30102 Triplet pregnancy, unspecified number of placenta and unspecified number of amniotic sacs, second trimester: Secondary | ICD-10-CM | POA: Insufficient documentation

## 2016-07-18 DIAGNOSIS — Z3A24 24 weeks gestation of pregnancy: Secondary | ICD-10-CM | POA: Insufficient documentation

## 2016-07-18 DIAGNOSIS — R197 Diarrhea, unspecified: Secondary | ICD-10-CM | POA: Insufficient documentation

## 2016-07-18 DIAGNOSIS — O26892 Other specified pregnancy related conditions, second trimester: Secondary | ICD-10-CM | POA: Diagnosis present

## 2016-07-18 DIAGNOSIS — K529 Noninfective gastroenteritis and colitis, unspecified: Secondary | ICD-10-CM | POA: Diagnosis not present

## 2016-07-18 DIAGNOSIS — O9989 Other specified diseases and conditions complicating pregnancy, childbirth and the puerperium: Secondary | ICD-10-CM | POA: Diagnosis not present

## 2016-07-18 DIAGNOSIS — O0992 Supervision of high risk pregnancy, unspecified, second trimester: Secondary | ICD-10-CM

## 2016-07-18 LAB — URINALYSIS, ROUTINE W REFLEX MICROSCOPIC
BILIRUBIN URINE: NEGATIVE
Glucose, UA: NEGATIVE mg/dL
Ketones, ur: 15 mg/dL — AB
Leukocytes, UA: NEGATIVE
Nitrite: NEGATIVE
PH: 5.5 (ref 5.0–8.0)
Protein, ur: 30 mg/dL — AB
SPECIFIC GRAVITY, URINE: 1.03 (ref 1.005–1.030)

## 2016-07-18 LAB — URINALYSIS, MICROSCOPIC (REFLEX)

## 2016-07-18 MED ORDER — ONDANSETRON HCL 4 MG/2ML IJ SOLN
4.0000 mg | Freq: Once | INTRAMUSCULAR | Status: AC
  Administered 2016-07-18: 4 mg via INTRAVENOUS
  Filled 2016-07-18: qty 2

## 2016-07-18 MED ORDER — LACTATED RINGERS IV BOLUS (SEPSIS)
1000.0000 mL | Freq: Once | INTRAVENOUS | Status: AC
Start: 2016-07-18 — End: 2016-07-19
  Administered 2016-07-18: 1000 mL via INTRAVENOUS

## 2016-07-18 NOTE — [facility] Provider Note (Signed)
First Provider Initiated Contact with Patient 07/18/16 2244       Chief Complaint:   Sabrina Sanchez is  26 y.o. [phone] at 256w4d with triplets presents complaining of possible food poisoning, nausea, diarrhea.  She states mild cramping that she believes is gas not contractions. No vaginal bleeding/discharge/ LOF, along with active fetal movement.  Obstetrical/Gynecological History: OB History    Gravida Para Term Preterm AB Living   4 3 3  0 0 3   SAB TAB Ectopic Multiple Live Births   0 0 0 0 3     Past Medical History: Past Medical History:  Diagnosis Date  . Fracture of wrist AGE 32 AND AGE 26  . Infection    UTI X 1  . Infection    OCC YEAST    Past Surgical History: Past Surgical History:  Procedure Laterality Date  . EYE SURGERY  AGE 17    Family History: Family History  Problem Relation Age of Onset  . Asthma Maternal Aunt   . Hyperlipidemia Maternal Grandmother   . Thrombophlebitis Maternal Grandmother   . Anesthesia problems Neg Hx     Social History: Social History  Substance Use Topics  . Smoking status: Never Smoker  . Smokeless tobacco: Never Used  . Alcohol use No    Allergies: No Known Allergies  Meds:  Prescriptions Prior to Admission  Medication Sig Dispense Refill Last Dose  . Prenatal Vit-Fe Fumarate-FA (PRENATAL MULTIVITAMIN) TABS tablet Take 1 tablet by mouth daily at 12 noon.   Taking    Review of Systems   Constitutional: Negative for fever and chills Eyes: Negative for visual disturbances Respiratory: Negative for shortness of breath, dyspnea Cardiovascular: Negative for chest pain or palpitations  Gastrointestinal: Nausea and diarrhea present- unable to eat since 1900 on 07/17/16. Negative for vomiting. Genitourinary: Negative for dysuria and urgency Musculoskeletal: Mild abdominal cramping associated with diarrhea. Negative for back pain. Normal ROM  Neurological: Negative for dizziness and headaches    Physical Exam  Blood  pressure 107/55, pulse 112, temperature 98.4 F (36.9 C), temperature source Oral, resp. rate 18, last menstrual period 02/04/2016, unknown if currently breastfeeding. GENERAL: Well-developed, dehydrated female.  LUNGS: Clear to auscultation bilaterally.  HEART: Regular rate and rhythm. ABDOMEN: Soft, nontender, nondistended, gravid, flatus present  EXTREMITIES: Nontender, no edema, 2+ distal pulses. DTR's 2+ FHT:  Baby A: Baseline rate 150 bpm   Variability moderate  Accelerations present   Decelerations none Baby B: Baseline rate 150 bpm   Variability moderate  Accelerations present   Decelerations none Baby C: Baseline rate 155 bpm   Variability moderate  Accelerations present   Decelerations none Contractions: Mild cramping- no contractions noted    Labs: Urinalysis Color, Urine YELLOW   APPearance CLEAR   Specific Gravity, Urine 1.030   pH 5.5   Glucose, UA NEGATIVE   Hgb urine dipstick LARGE    Bilirubin Urine NEGATIVE   Ketones, ur 15    Protein, ur 30    Nitrite NEGATIVE   Leukocytes, UA NEGATIVE    Assessment: Sabrina EdmanKache Sanchez is  26 y.o. G4P3003 at 296w4d presents with possible food poisoning, nausea, diarrhea, mild abdominal cramping.  Dehydration- urinalysis results   Plan: IV fluid bolus, antiemetic IV medicine. Zofran prescription sent  Education on food choices to relieve diarrhea DC Home   [facility]   Sabrina Sanchez BSN, [place] 3/2/201810:46 PM   CNM attestation:  I have seen and examined this patient; I agree  with above documentation in the midwifery student's note.   Sabrina Sanchez is a 26 y.o. [phone] reporting diarrhea/nausea since 5pm 3/1 +FM, denies LOF, VB, contractions, vaginal discharge.  PE: BP 101/61 (BP Location: Right Arm)   Pulse 110   Temp 98.4 F (36.9 C) (Oral)   Resp 18   LMP 02/04/2016  Gen: calm comfortable, NAD Resp: normal effort, no distress Abd: gravid  ROS, labs, PMH reviewed NST appropriate for GA x  3  Given IVF and Zofran in MAU- feels better  Plan: - Encouraged BRATT diet - continue routine follow up in OB clinic  Sabrina Sanchez, CNM 12:27 AM

## 2016-07-19 DIAGNOSIS — K529 Noninfective gastroenteritis and colitis, unspecified: Secondary | ICD-10-CM

## 2016-07-19 DIAGNOSIS — O9989 Other specified diseases and conditions complicating pregnancy, childbirth and the puerperium: Secondary | ICD-10-CM

## 2016-07-19 MED ORDER — ONDANSETRON 4 MG PO TBDP: 4.0000 mg | ORAL_TABLET | Freq: Three times a day (TID) | ORAL | 0 refills | Status: DC | PRN

## 2016-07-19 NOTE — Discharge Instructions (Signed)
Food Choices to Help Relieve Diarrhea, Adult When you have diarrhea, the foods you eat and your eating habits are very important. Choosing the right foods and drinks can help:  Relieve diarrhea.  Replace lost fluids and nutrients.  Prevent dehydration.  What general guidelines should I follow? Relieving diarrhea  Choose foods with less than 2 g or .07 oz. of fiber per serving.  Limit fats to less than 8 tsp (38 g or 1.34 oz.) a day.  Avoid the following: ? Foods and beverages sweetened with high-fructose corn syrup, honey, or sugar alcohols such as xylitol, sorbitol, and mannitol. ? Foods that contain a lot of fat or sugar. ? Fried, greasy, or spicy foods. ? High-fiber grains, breads, and cereals. ? Raw fruits and vegetables.  Eat foods that are rich in probiotics. These foods include dairy products such as yogurt and fermented milk products. They help increase healthy bacteria in the stomach and intestines (gastrointestinal tract, or GI tract).  If you have lactose intolerance, avoid dairy products. These may make your diarrhea worse.  Take medicine to help stop diarrhea (antidiarrheal medicine) only as told by your health care provider. Replacing nutrients  Eat small meals or snacks every 3-4 hours.  Eat bland foods, such as white rice, toast, or baked potato, until your diarrhea starts to get better. Gradually reintroduce nutrient-rich foods as tolerated or as told by your health care provider. This includes: ? Well-cooked protein foods. ? Peeled, seeded, and soft-cooked fruits and vegetables. ? Low-fat dairy products.  Take vitamin and mineral supplements as told by your health care provider. Preventing dehydration   Start by sipping water or a special solution to prevent dehydration (oral rehydration solution, ORS). Urine that is clear or pale yellow means that you are getting enough fluid.  Try to drink at least 8-10 cups of fluid each day to help replace lost  fluids.  You may add other liquids in addition to water, such as clear juice or decaffeinated sports drinks, as tolerated or as told by your health care provider.  Avoid drinks with caffeine, such as coffee, tea, or soft drinks.  Avoid alcohol. What foods are recommended? The items listed may not be a complete list. Talk with your health care provider about what dietary choices are best for you. Grains White rice. White, French, or pita breads (fresh or toasted), including plain rolls, buns, or bagels. White pasta. Saltine, soda, or graham crackers. Pretzels. Low-fiber cereal. Cooked cereals made with water (such as cornmeal, farina, or cream cereals). Plain muffins. Matzo. Melba toast. Zwieback. Vegetables Potatoes (without the skin). Most well-cooked and canned vegetables without skins or seeds. Tender lettuce. Fruits Apple sauce. Fruits canned in juice. Cooked apricots, cherries, grapefruit, peaches, pears, or plums. Fresh bananas and cantaloupe. Meats and other protein foods Baked or boiled chicken. Eggs. Tofu. Fish. Seafood. Smooth nut butters. Ground or well-cooked tender beef, ham, veal, lamb, pork, or poultry. Dairy Plain yogurt, kefir, and unsweetened liquid yogurt. Lactose-free milk, buttermilk, skim milk, or soy milk. Low-fat or nonfat hard cheese. Beverages Water. Low-calorie sports drinks. Fruit juices without pulp. Strained tomato and vegetable juices. Decaffeinated teas. Sugar-free beverages not sweetened with sugar alcohols. Oral rehydration solutions, if approved by your health care provider. Seasoning and other foods Bouillon, broth, or soups made from recommended foods. What foods are not recommended? The items listed may not be a complete list. Talk with your health care provider about what dietary choices are best for you. Grains Whole grain, whole wheat,   bran, or rye breads, rolls, pastas, and crackers. Wild or brown rice. Whole grain or bran cereals. Barley. Oats and  oatmeal. Corn tortillas or taco shells. Granola. Popcorn. Vegetables Raw vegetables. Fried vegetables. Cabbage, broccoli, Brussels sprouts, artichokes, baked beans, beet greens, corn, kale, legumes, peas, sweet potatoes, and yams. Potato skins. Cooked spinach and cabbage. Fruits Dried fruit, including raisins and dates. Raw fruits. Stewed or dried prunes. Canned fruits with syrup. Meat and other protein foods Fried or fatty meats. Deli meats. Chunky nut butters. Nuts and seeds. Beans and lentils. Bacon. Hot dogs. Sausage. Dairy High-fat cheeses. Whole milk, chocolate milk, and beverages made with milk, such as milk shakes. Half-and-half. Cream. sour cream. Ice cream. Beverages Caffeinated beverages (such as coffee, tea, soda, or energy drinks). Alcoholic beverages. Fruit juices with pulp. Prune juice. Soft drinks sweetened with high-fructose corn syrup or sugar alcohols. High-calorie sports drinks. Fats and oils Butter. Cream sauces. Margarine. Salad oils. Plain salad dressings. Olives. Avocados. Mayonnaise. Sweets and desserts Sweet rolls, doughnuts, and sweet breads. Sugar-free desserts sweetened with sugar alcohols such as xylitol and sorbitol. Seasoning and other foods Honey. Hot sauce. Chili powder. Gravy. Cream-based or milk-based soups. Pancakes and waffles. Summary  When you have diarrhea, the foods you eat and your eating habits are very important.  Make sure you get at least 8-10 cups of fluid each day, or enough to keep your urine clear or pale yellow.  Eat bland foods and gradually reintroduce healthy, nutrient-rich foods as tolerated, or as told by your health care provider.  Avoid high-fiber, fried, greasy, or spicy foods. This information is not intended to replace advice given to you by your health care provider. Make sure you discuss any questions you have with your health care provider. Document Released: 07/26/2003 Document Revised: 05/02/2016 Document Reviewed:  05/02/2016 Elsevier Interactive Patient Education  2017 Elsevier Inc.  

## 2016-07-19 NOTE — MAU Note (Signed)
NO VOMITING OR DIARRHEA WHILE  HERE IN MAU-  ATE ICE  - SAYS FLUIDS AND  MEDS - SHE FEELS   BETTER

## 2016-07-23 ENCOUNTER — Ambulatory Visit (INDEPENDENT_AMBULATORY_CARE_PROVIDER_SITE_OTHER): Payer: BLUE CROSS/BLUE SHIELD | Admitting: Family Medicine

## 2016-07-23 VITALS — BP 108/71 | HR 94 | Wt 187.0 lb

## 2016-07-23 DIAGNOSIS — O0992 Supervision of high risk pregnancy, unspecified, second trimester: Secondary | ICD-10-CM

## 2016-07-23 DIAGNOSIS — O30109 Triplet pregnancy, unspecified number of placenta and unspecified number of amniotic sacs, unspecified trimester: Secondary | ICD-10-CM

## 2016-07-23 DIAGNOSIS — O0942 Supervision of pregnancy with grand multiparity, second trimester: Secondary | ICD-10-CM

## 2016-07-23 DIAGNOSIS — O30102 Triplet pregnancy, unspecified number of placenta and unspecified number of amniotic sacs, second trimester: Secondary | ICD-10-CM

## 2016-07-23 NOTE — Progress Notes (Signed)
   PRENATAL VISIT NOTE  Subjective:  Sabrina Sanchez is a 26 y.o. G4P3003 at 4987w2d being seen today for ongoing prenatal care.  She is currently monitored for the following issues for this high-risk pregnancy and has Triplet gestation; Supervision of high-risk pregnancy; and Left inguinal hernia on her problem list.  Patient reports no complaints.  Contractions: Irritability. Vag. Bleeding: None.  Movement: Present. Denies leaking of fluid.   The following portions of the patient's history were reviewed and updated as appropriate: allergies, current medications, past family history, past medical history, past social history, past surgical history and problem list. Problem list updated.  Objective:   Vitals:   07/23/16 1013  BP: 108/71  Pulse: 94  Weight: 187 lb (84.8 kg)    Fetal Status: Fetal Heart Rate (bpm): 138/148/143   Movement: Present     General:  Alert, oriented and cooperative. Patient is in no acute distress.  Skin: Skin is warm and dry. No rash noted.   Cardiovascular: Normal heart rate noted  Respiratory: Normal respiratory effort, no problems with respiration noted  Abdomen: Soft, gravid, appropriate for gestational age. Pain/Pressure: Present     Pelvic:  Cervical exam deferred        Extremities: Normal range of motion.  Edema: None  Mental Status: Normal mood and affect. Normal behavior. Normal judgment and thought content.   Assessment and Plan:  Pregnancy: G4P3003 at 5387w2d  1. Supervision of high risk pregnancy in second trimester 28 wk labs and TDaP at next visit.  2. Triplet gestation Recent u/s for growth shows appropriate growth. Nml Cervical length. Lengthy discussion with patient around attempts at vaginal birth. She is understanding of limitations, risks, need for emergent Cesarean, etc. Will be available as possible for this, given initial baby is in vertical lie and risks of interlocking chins are not present.  Preterm labor symptoms and general  obstetric precautions including but not limited to vaginal bleeding, contractions, leaking of fluid and fetal movement were reviewed in detail with the patient. Please refer to After Visit Summary for other counseling recommendations.  Return in 2 weeks (on 08/06/2016).   Reva Boresanya S Nara Paternoster, MD

## 2016-07-23 NOTE — Patient Instructions (Signed)
 Second Trimester of Pregnancy The second trimester is from week 14 through week 27 (months 4 through 6). The second trimester is often a time when you feel your best. Your body has adjusted to being pregnant, and you begin to feel better physically. Usually, morning sickness has lessened or quit completely, you may have more energy, and you may have an increase in appetite. The second trimester is also a time when the fetus is growing rapidly. At the end of the sixth month, the fetus is about 9 inches long and weighs about 1 pounds. You will likely begin to feel the baby move (quickening) between 16 and 20 weeks of pregnancy. Body changes during your second trimester Your body continues to go through many changes during your second trimester. The changes vary from woman to woman.  Your weight will continue to increase. You will notice your lower abdomen bulging out.  You may begin to get stretch marks on your hips, abdomen, and breasts.  You may develop headaches that can be relieved by medicines. The medicines should be approved by your health care provider.  You may urinate more often because the fetus is pressing on your bladder.  You may develop or continue to have heartburn as a result of your pregnancy.  You may develop constipation because certain hormones are causing the muscles that push waste through your intestines to slow down.  You may develop hemorrhoids or swollen, bulging veins (varicose veins).  You may have back pain. This is caused by: ? Weight gain. ? Pregnancy hormones that are relaxing the joints in your pelvis. ? A shift in weight and the muscles that support your balance.  Your breasts will continue to grow and they will continue to become tender.  Your gums may bleed and may be sensitive to brushing and flossing.  Dark spots or blotches (chloasma, mask of pregnancy) may develop on your face. This will likely fade after the baby is born.  A dark line from  your belly button to the pubic area (linea nigra) may appear. This will likely fade after the baby is born.  You may have changes in your hair. These can include thickening of your hair, rapid growth, and changes in texture. Some women also have hair loss during or after pregnancy, or hair that feels dry or thin. Your hair will most likely return to normal after your baby is born.  What to expect at prenatal visits During a routine prenatal visit:  You will be weighed to make sure you and the fetus are growing normally.  Your blood pressure will be taken.  Your abdomen will be measured to track your baby's growth.  The fetal heartbeat will be listened to.  Any test results from the previous visit will be discussed.  Your health care provider may ask you:  How you are feeling.  If you are feeling the baby move.  If you have had any abnormal symptoms, such as leaking fluid, bleeding, severe headaches, or abdominal cramping.  If you are using any tobacco products, including cigarettes, chewing tobacco, and electronic cigarettes.  If you have any questions.  Other tests that may be performed during your second trimester include:  Blood tests that check for: ? Low iron levels (anemia). ? High blood sugar that affects pregnant women (gestational diabetes) between 24 and 28 weeks. ? Rh antibodies. This is to check for a protein on red blood cells (Rh factor).  Urine tests to check for infections, diabetes,   or protein in the urine.  An ultrasound to confirm the proper growth and development of the baby.  An amniocentesis to check for possible genetic problems.  Fetal screens for spina bifida and Down syndrome.  HIV (human immunodeficiency virus) testing. Routine prenatal testing includes screening for HIV, unless you choose not to have this test.  Follow these instructions at home: Medicines  Follow your health care provider's instructions regarding medicine use. Specific  medicines may be either safe or unsafe to take during pregnancy.  Take a prenatal vitamin that contains at least 600 micrograms (mcg) of folic acid.  If you develop constipation, try taking a stool softener if your health care provider approves. Eating and drinking  Eat a balanced diet that includes fresh fruits and vegetables, whole grains, good sources of protein such as meat, eggs, or tofu, and low-fat dairy. Your health care provider will help you determine the amount of weight gain that is right for you.  Avoid raw meat and uncooked cheese. These carry germs that can cause birth defects in the baby.  If you have low calcium intake from food, talk to your health care provider about whether you should take a daily calcium supplement.  Limit foods that are high in fat and processed sugars, such as fried and sweet foods.  To prevent constipation: ? Drink enough fluid to keep your urine clear or pale yellow. ? Eat foods that are high in fiber, such as fresh fruits and vegetables, whole grains, and beans. Activity  Exercise only as directed by your health care provider. Most women can continue their usual exercise routine during pregnancy. Try to exercise for 30 minutes at least 5 days a week. Stop exercising if you experience uterine contractions.  Avoid heavy lifting, wear low heel shoes, and practice good posture.  A sexual relationship may be continued unless your health care provider directs you otherwise. Relieving pain and discomfort  Wear a good support bra to prevent discomfort from breast tenderness.  Take warm sitz baths to soothe any pain or discomfort caused by hemorrhoids. Use hemorrhoid cream if your health care provider approves.  Rest with your legs elevated if you have leg cramps or low back pain.  If you develop varicose veins, wear support hose. Elevate your feet for 15 minutes, 3-4 times a day. Limit salt in your diet. Prenatal Care  Write down your questions.  Take them to your prenatal visits.  Keep all your prenatal visits as told by your health care provider. This is important. Safety  Wear your seat belt at all times when driving.  Make a list of emergency phone numbers, including numbers for family, friends, the hospital, and police and fire departments. General instructions  Ask your health care provider for a referral to a local prenatal education class. Begin classes no later than the beginning of month 6 of your pregnancy.  Ask for help if you have counseling or nutritional needs during pregnancy. Your health care provider can offer advice or refer you to specialists for help with various needs.  Do not use hot tubs, steam rooms, or saunas.  Do not douche or use tampons or scented sanitary pads.  Do not cross your legs for long periods of time.  Avoid cat litter boxes and soil used by cats. These carry germs that can cause birth defects in the baby and possibly loss of the fetus by miscarriage or stillbirth.  Avoid all smoking, herbs, alcohol, and unprescribed drugs. Chemicals in these products   can affect the formation and growth of the baby.  Do not use any products that contain nicotine or tobacco, such as cigarettes and e-cigarettes. If you need help quitting, ask your health care provider.  Visit your dentist if you have not gone yet during your pregnancy. Use a soft toothbrush to brush your teeth and be gentle when you floss. Contact a health care provider if:  You have dizziness.  You have mild pelvic cramps, pelvic pressure, or nagging pain in the abdominal area.  You have persistent nausea, vomiting, or diarrhea.  You have a bad smelling vaginal discharge.  You have pain when you urinate. Get help right away if:  You have a fever.  You are leaking fluid from your vagina.  You have spotting or bleeding from your vagina.  You have severe abdominal cramping or pain.  You have rapid weight gain or weight  loss.  You have shortness of breath with chest pain.  You notice sudden or extreme swelling of your face, hands, ankles, feet, or legs.  You have not felt your baby move in over an hour.  You have severe headaches that do not go away when you take medicine.  You have vision changes. Summary  The second trimester is from week 14 through week 27 (months 4 through 6). It is also a time when the fetus is growing rapidly.  Your body goes through many changes during pregnancy. The changes vary from woman to woman.  Avoid all smoking, herbs, alcohol, and unprescribed drugs. These chemicals affect the formation and growth your baby.  Do not use any tobacco products, such as cigarettes, chewing tobacco, and e-cigarettes. If you need help quitting, ask your health care provider.  Contact your health care provider if you have any questions. Keep all prenatal visits as told by your health care provider. This is important. This information is not intended to replace advice given to you by your health care provider. Make sure you discuss any questions you have with your health care provider. Document Released: 04/29/2001 Document Revised: 10/11/2015 Document Reviewed: 07/06/2012 [employer] Interactive Patient Education  2017 [employer]   Breastfeeding Deciding to breastfeed is one of the best choices you can make for you and your baby. A change in hormones during pregnancy causes your breast tissue to grow and increases the number and size of your milk ducts. These hormones also allow proteins, sugars, and fats from your blood supply to make breast milk in your milk-producing glands. Hormones prevent breast milk from being released before your baby is born as well as prompt milk flow after birth. Once breastfeeding has begun, thoughts of your baby, as well as his or her sucking or crying, can stimulate the release of milk from your milk-producing glands. Benefits of breastfeeding For Your  Baby  Your first milk (colostrum) helps your baby's digestive system function better.  There are antibodies in your milk that help your baby fight off infections.  Your baby has a lower incidence of asthma, allergies, and sudden infant death syndrome.  The nutrients in breast milk are better for your baby than infant formulas and are designed uniquely for your baby's needs.  Breast milk improves your baby's brain development.  Your baby is less likely to develop other conditions, such as childhood obesity, asthma, or type 2 diabetes mellitus.  For You  Breastfeeding helps to create a very special bond between you and your baby.  Breastfeeding is convenient. Breast milk is always available at   the correct temperature and costs nothing.  Breastfeeding helps to burn calories and helps you lose the weight gained during pregnancy.  Breastfeeding makes your uterus contract to its prepregnancy size faster and slows bleeding (lochia) after you give birth.  Breastfeeding helps to lower your risk of developing type 2 diabetes mellitus, osteoporosis, and breast or ovarian cancer later in life.  Signs that your baby is hungry Early Signs of Hunger  Increased alertness or activity.  Stretching.  Movement of the head from side to side.  Movement of the head and opening of the mouth when the corner of the mouth or cheek is stroked (rooting).  Increased sucking sounds, smacking lips, cooing, sighing, or squeaking.  Hand-to-mouth movements.  Increased sucking of fingers or hands.  Late Signs of Hunger  Fussing.  Intermittent crying.  Extreme Signs of Hunger Signs of extreme hunger will require calming and consoling before your baby will be able to breastfeed successfully. Do not wait for the following signs of extreme hunger to occur before you initiate breastfeeding:  Restlessness.  A loud, strong cry.  Screaming.  Breastfeeding basics Breastfeeding Initiation  Find a  comfortable place to sit or lie down, with your neck and back well supported.  Place a pillow or rolled up blanket under your baby to bring him or her to the level of your breast (if you are seated). Nursing pillows are specially designed to help support your arms and your baby while you breastfeed.  Make sure that your baby's abdomen is facing your abdomen.  Gently massage your breast. With your fingertips, massage from your chest wall toward your nipple in a circular motion. This encourages milk flow. You may need to continue this action during the feeding if your milk flows slowly.  Support your breast with 4 fingers underneath and your thumb above your nipple. Make sure your fingers are well away from your nipple and your baby's mouth.  Stroke your baby's lips gently with your finger or nipple.  When your baby's mouth is open wide enough, quickly bring your baby to your breast, placing your entire nipple and as much of the colored area around your nipple (areola) as possible into your baby's mouth. ? More areola should be visible above your baby's upper lip than below the lower lip. ? Your baby's tongue should be between his or her lower gum and your breast.  Ensure that your baby's mouth is correctly positioned around your nipple (latched). Your baby's lips should create a seal on your breast and be turned out (everted).  It is common for your baby to suck about 2-3 minutes in order to start the flow of breast milk.  Latching Teaching your baby how to latch on to your breast properly is very important. An improper latch can cause nipple pain and decreased milk supply for you and poor weight gain in your baby. Also, if your baby is not latched onto your nipple properly, he or she may swallow some air during feeding. This can make your baby fussy. Burping your baby when you switch breasts during the feeding can help to get rid of the air. However, teaching your baby to latch on properly is  still the best way to prevent fussiness from swallowing air while breastfeeding. Signs that your baby has successfully latched on to your nipple:  Silent tugging or silent sucking, without causing you pain.  Swallowing heard between every 3-4 sucks.  Muscle movement above and in front of his or her   ears while sucking.  Signs that your baby has not successfully latched on to nipple:  Sucking sounds or smacking sounds from your baby while breastfeeding.  Nipple pain.  If you think your baby has not latched on correctly, slip your finger into the corner of your baby's mouth to break the suction and place it between your baby's gums. Attempt breastfeeding initiation again. Signs of Successful Breastfeeding Signs from your baby:  A gradual decrease in the number of sucks or complete cessation of sucking.  Falling asleep.  Relaxation of his or her body.  Retention of a small amount of milk in his or her mouth.  Letting go of your breast by himself or herself.  Signs from you:  Breasts that have increased in firmness, weight, and size 1-3 hours after feeding.  Breasts that are softer immediately after breastfeeding.  Increased milk volume, as well as a change in milk consistency and color by the fifth day of breastfeeding.  Nipples that are not sore, cracked, or bleeding.  Signs That Your Baby is Getting Enough Milk  Wetting at least 1-2 diapers during the first 24 hours after birth.  Wetting at least 5-6 diapers every 24 hours for the first week after birth. The urine should be clear or pale yellow by 5 days after birth.  Wetting 6-8 diapers every 24 hours as your baby continues to grow and develop.  At least 3 stools in a 24-hour period by age 5 days. The stool should be soft and yellow.  At least 3 stools in a 24-hour period by age 7 days. The stool should be seedy and yellow.  No loss of weight greater than 10% of birth weight during the first 3 days of age.  Average  weight gain of 4-7 ounces (113-198 g) per week after age 4 days.  Consistent daily weight gain by age 5 days, without weight loss after the age of 2 weeks.  After a feeding, your baby may spit up a small amount. This is common. Breastfeeding frequency and duration Frequent feeding will help you make more milk and can prevent sore nipples and breast engorgement. Breastfeed when you feel the need to reduce the fullness of your breasts or when your baby shows signs of hunger. This is called "breastfeeding on demand." Avoid introducing a pacifier to your baby while you are working to establish breastfeeding (the first 4-6 weeks after your baby is born). After this time you may choose to use a pacifier. Research has shown that pacifier use during the first year of a baby's life decreases the risk of sudden infant death syndrome (SIDS). Allow your baby to feed on each breast as long as he or she wants. Breastfeed until your baby is finished feeding. When your baby unlatches or falls asleep while feeding from the first breast, offer the second breast. Because newborns are often sleepy in the first few weeks of life, you may need to awaken your baby to get him or her to feed. Breastfeeding times will vary from baby to baby. However, the following rules can serve as a guide to help you ensure that your baby is properly fed:  Newborns (babies 4 weeks of age or younger) may breastfeed every 1-3 hours.  Newborns should not go longer than 3 hours during the day or 5 hours during the night without breastfeeding.  You should breastfeed your baby a minimum of 8 times in a 24-hour period until you begin to introduce solid foods to your   baby at around 6 months of age.  Breast milk pumping Pumping and storing breast milk allows you to ensure that your baby is exclusively fed your breast milk, even at times when you are unable to breastfeed. This is especially important if you are going back to work while you are still  breastfeeding or when you are not able to be present during feedings. Your lactation consultant can give you guidelines on how long it is safe to store breast milk. A breast pump is a machine that allows you to pump milk from your breast into a sterile bottle. The pumped breast milk can then be stored in a refrigerator or freezer. Some breast pumps are operated by hand, while others use electricity. Ask your lactation consultant which type will work best for you. Breast pumps can be purchased, but some hospitals and breastfeeding support groups lease breast pumps on a monthly basis. A lactation consultant can teach you how to hand express breast milk, if you prefer not to use a pump. Caring for your breasts while you breastfeed Nipples can become dry, cracked, and sore while breastfeeding. The following recommendations can help keep your breasts moisturized and healthy:  Avoid using soap on your nipples.  Wear a supportive bra. Although not required, special nursing bras and tank tops are designed to allow access to your breasts for breastfeeding without taking off your entire bra or top. Avoid wearing underwire-style bras or extremely tight bras.  Air dry your nipples for 3-4minutes after each feeding.  Use only cotton bra pads to absorb leaked breast milk. Leaking of breast milk between feedings is normal.  Use lanolin on your nipples after breastfeeding. Lanolin helps to maintain your skin's normal moisture barrier. If you use pure lanolin, you do not need to wash it off before feeding your baby again. Pure lanolin is not toxic to your baby. You may also hand express a few drops of breast milk and gently massage that milk into your nipples and allow the milk to air dry.  In the first few weeks after giving birth, some women experience extremely full breasts (engorgement). Engorgement can make your breasts feel heavy, warm, and tender to the touch. Engorgement peaks within 3-5 days after you give  birth. The following recommendations can help ease engorgement:  Completely empty your breasts while breastfeeding or pumping. You may want to start by applying warm, moist heat (in the shower or with warm water-soaked hand towels) just before feeding or pumping. This increases circulation and helps the milk flow. If your baby does not completely empty your breasts while breastfeeding, pump any extra milk after he or she is finished.  Wear a snug bra (nursing or regular) or tank top for 1-2 days to signal your body to slightly decrease milk production.  Apply ice packs to your breasts, unless this is too uncomfortable for you.  Make sure that your baby is latched on and positioned properly while breastfeeding.  If engorgement persists after 48 hours of following these recommendations, contact your health care provider or a lactation consultant. Overall health care recommendations while breastfeeding  Eat healthy foods. Alternate between meals and snacks, eating 3 of each per day. Because what you eat affects your breast milk, some of the foods may make your baby more irritable than usual. Avoid eating these foods if you are sure that they are negatively affecting your baby.  Drink milk, fruit juice, and water to satisfy your thirst (about 10 glasses a day).    Rest often, relax, and continue to take your prenatal vitamins to prevent fatigue, stress, and anemia.  Continue breast self-awareness checks.  Avoid chewing and smoking tobacco. Chemicals from cigarettes that pass into breast milk and exposure to secondhand smoke may harm your baby.  Avoid alcohol and drug use, including marijuana. Some medicines that may be harmful to your baby can pass through breast milk. It is important to ask your health care provider before taking any medicine, including all over-the-counter and prescription medicine as well as vitamin and herbal supplements. It is possible to become pregnant while breastfeeding.  If birth control is desired, ask your health care provider about options that will be safe for your baby. Contact a health care provider if:  You feel like you want to stop breastfeeding or have become frustrated with breastfeeding.  You have painful breasts or nipples.  Your nipples are cracked or bleeding.  Your breasts are red, tender, or warm.  You have a swollen area on either breast.  You have a fever or chills.  You have nausea or vomiting.  You have drainage other than breast milk from your nipples.  Your breasts do not become full before feedings by the fifth day after you give birth.  You feel sad and depressed.  Your baby is too sleepy to eat well.  Your baby is having trouble sleeping.  Your baby is wetting less than 3 diapers in a 24-hour period.  Your baby has less than 3 stools in a 24-hour period.  Your baby's skin or the white part of his or her eyes becomes yellow.  Your baby is not gaining weight by 5 days of age. Get help right away if:  Your baby is overly tired (lethargic) and does not want to wake up and feed.  Your baby develops an unexplained fever. This information is not intended to replace advice given to you by your health care provider. Make sure you discuss any questions you have with your health care provider. Document Released: 05/05/2005 Document Revised: 10/17/2015 Document Reviewed: 10/27/2012 [employer] Interactive Patient Education  2017 [employer]  

## 2016-08-05 ENCOUNTER — Ambulatory Visit (INDEPENDENT_AMBULATORY_CARE_PROVIDER_SITE_OTHER): Payer: BLUE CROSS/BLUE SHIELD | Admitting: Family Medicine

## 2016-08-05 VITALS — BP 110/67 | HR 109 | Wt 194.0 lb

## 2016-08-05 DIAGNOSIS — O0992 Supervision of high risk pregnancy, unspecified, second trimester: Secondary | ICD-10-CM

## 2016-08-05 DIAGNOSIS — O30102 Triplet pregnancy, unspecified number of placenta and unspecified number of amniotic sacs, second trimester: Secondary | ICD-10-CM | POA: Diagnosis not present

## 2016-08-05 NOTE — Patient Instructions (Signed)
Breastfeeding Deciding to breastfeed is one of the best choices you can make for you and your baby. A change in hormones during pregnancy causes your breast tissue to grow and increases the number and size of your milk ducts. These hormones also allow proteins, sugars, and fats from your blood supply to make breast milk in your milk-producing glands. Hormones prevent breast milk from being released before your baby is born as well as prompt milk flow after birth. Once breastfeeding has begun, thoughts of your baby, as well as his or her sucking or crying, can stimulate the release of milk from your milk-producing glands. Benefits of breastfeeding For Your Baby  Your first milk (colostrum) helps your baby's digestive system function better.  There are antibodies in your milk that help your baby fight off infections.  Your baby has a lower incidence of asthma, allergies, and sudden infant death syndrome.  The nutrients in breast milk are better for your baby than infant formulas and are designed uniquely for your baby's needs.  Breast milk improves your baby's brain development.  Your baby is less likely to develop other conditions, such as childhood obesity, asthma, or type 2 diabetes mellitus.  For You  Breastfeeding helps to create a very special bond between you and your baby.  Breastfeeding is convenient. Breast milk is always available at the correct temperature and costs nothing.  Breastfeeding helps to burn calories and helps you lose the weight gained during pregnancy.  Breastfeeding makes your uterus contract to its prepregnancy size faster and slows bleeding (lochia) after you give birth.  Breastfeeding helps to lower your risk of developing type 2 diabetes mellitus, osteoporosis, and breast or ovarian cancer later in life.  Signs that your baby is hungry Early Signs of Hunger  Increased alertness or activity.  Stretching.  Movement of the head from side to  side.  Movement of the head and opening of the mouth when the corner of the mouth or cheek is stroked (rooting).  Increased sucking sounds, smacking lips, cooing, sighing, or squeaking.  Hand-to-mouth movements.  Increased sucking of fingers or hands.  Late Signs of Hunger  Fussing.  Intermittent crying.  Extreme Signs of Hunger Signs of extreme hunger will require calming and consoling before your baby will be able to breastfeed successfully. Do not wait for the following signs of extreme hunger to occur before you initiate breastfeeding:  Restlessness.  A loud, strong cry.  Screaming.  Breastfeeding basics Breastfeeding Initiation  Find a comfortable place to sit or lie down, with your neck and back well supported.  Place a pillow or rolled up blanket under your baby to bring him or her to the level of your breast (if you are seated). Nursing pillows are specially designed to help support your arms and your baby while you breastfeed.  Make sure that your baby's abdomen is facing your abdomen.  Gently massage your breast. With your fingertips, massage from your chest wall toward your nipple in a circular motion. This encourages milk flow. You may need to continue this action during the feeding if your milk flows slowly.  Support your breast with 4 fingers underneath and your thumb above your nipple. Make sure your fingers are well away from your nipple and your baby's mouth.  Stroke your baby's lips gently with your finger or nipple.  When your baby's mouth is open wide enough, quickly bring your baby to your breast, placing your entire nipple and as much of the colored area   around your nipple (areola) as possible into your baby's mouth. ? More areola should be visible above your baby's upper lip than below the lower lip. ? Your baby's tongue should be between his or her lower gum and your breast.  Ensure that your baby's mouth is correctly positioned around your nipple  (latched). Your baby's lips should create a seal on your breast and be turned out (everted).  It is common for your baby to suck about 2-3 minutes in order to start the flow of breast milk.  Latching Teaching your baby how to latch on to your breast properly is very important. An improper latch can cause nipple pain and decreased milk supply for you and poor weight gain in your baby. Also, if your baby is not latched onto your nipple properly, he or she may swallow some air during feeding. This can make your baby fussy. Burping your baby when you switch breasts during the feeding can help to get rid of the air. However, teaching your baby to latch on properly is still the best way to prevent fussiness from swallowing air while breastfeeding. Signs that your baby has successfully latched on to your nipple:  Silent tugging or silent sucking, without causing you pain.  Swallowing heard between every 3-4 sucks.  Muscle movement above and in front of his or her ears while sucking.  Signs that your baby has not successfully latched on to nipple:  Sucking sounds or smacking sounds from your baby while breastfeeding.  Nipple pain.  If you think your baby has not latched on correctly, slip your finger into the corner of your baby's mouth to break the suction and place it between your baby's gums. Attempt breastfeeding initiation again. Signs of Successful Breastfeeding Signs from your baby:  A gradual decrease in the number of sucks or complete cessation of sucking.  Falling asleep.  Relaxation of his or her body.  Retention of a small amount of milk in his or her mouth.  Letting go of your breast by himself or herself.  Signs from you:  Breasts that have increased in firmness, weight, and size 1-3 hours after feeding.  Breasts that are softer immediately after breastfeeding.  Increased milk volume, as well as a change in milk consistency and color by the fifth day of  breastfeeding.  Nipples that are not sore, cracked, or bleeding.  Signs That Your Baby is Getting Enough Milk  Wetting at least 1-2 diapers during the first 24 hours after birth.  Wetting at least 5-6 diapers every 24 hours for the first week after birth. The urine should be clear or pale yellow by 5 days after birth.  Wetting 6-8 diapers every 24 hours as your baby continues to grow and develop.  At least 3 stools in a 24-hour period by age 5 days. The stool should be soft and yellow.  At least 3 stools in a 24-hour period by age 7 days. The stool should be seedy and yellow.  No loss of weight greater than 10% of birth weight during the first 3 days of age.  Average weight gain of 4-7 ounces (113-198 g) per week after age 4 days.  Consistent daily weight gain by age 5 days, without weight loss after the age of 2 weeks.  After a feeding, your baby may spit up a small amount. This is common. Breastfeeding frequency and duration Frequent feeding will help you make more milk and can prevent sore nipples and breast engorgement. Breastfeed when   you feel the need to reduce the fullness of your breasts or when your baby shows signs of hunger. This is called "breastfeeding on demand." Avoid introducing a pacifier to your baby while you are working to establish breastfeeding (the first 4-6 weeks after your baby is born). After this time you may choose to use a pacifier. Research has shown that pacifier use during the first year of a baby's life decreases the risk of sudden infant death syndrome (SIDS). Allow your baby to feed on each breast as long as he or she wants. Breastfeed until your baby is finished feeding. When your baby unlatches or falls asleep while feeding from the first breast, offer the second breast. Because newborns are often sleepy in the first few weeks of life, you may need to awaken your baby to get him or her to feed. Breastfeeding times will vary from baby to baby. However,  the following rules can serve as a guide to help you ensure that your baby is properly fed:  Newborns (babies 4 weeks of age or younger) may breastfeed every 1-3 hours.  Newborns should not go longer than 3 hours during the day or 5 hours during the night without breastfeeding.  You should breastfeed your baby a minimum of 8 times in a 24-hour period until you begin to introduce solid foods to your baby at around 6 months of age.  Breast milk pumping Pumping and storing breast milk allows you to ensure that your baby is exclusively fed your breast milk, even at times when you are unable to breastfeed. This is especially important if you are going back to work while you are still breastfeeding or when you are not able to be present during feedings. Your lactation consultant can give you guidelines on how long it is safe to store breast milk. A breast pump is a machine that allows you to pump milk from your breast into a sterile bottle. The pumped breast milk can then be stored in a refrigerator or freezer. Some breast pumps are operated by hand, while others use electricity. Ask your lactation consultant which type will work best for you. Breast pumps can be purchased, but some hospitals and breastfeeding support groups lease breast pumps on a monthly basis. A lactation consultant can teach you how to hand express breast milk, if you prefer not to use a pump. Caring for your breasts while you breastfeed Nipples can become dry, cracked, and sore while breastfeeding. The following recommendations can help keep your breasts moisturized and healthy:  Avoid using soap on your nipples.  Wear a supportive bra. Although not required, special nursing bras and tank tops are designed to allow access to your breasts for breastfeeding without taking off your entire bra or top. Avoid wearing underwire-style bras or extremely tight bras.  Air dry your nipples for 3-4minutes after each feeding.  Use only cotton  bra pads to absorb leaked breast milk. Leaking of breast milk between feedings is normal.  Use lanolin on your nipples after breastfeeding. Lanolin helps to maintain your skin's normal moisture barrier. If you use pure lanolin, you do not need to wash it off before feeding your baby again. Pure lanolin is not toxic to your baby. You may also hand express a few drops of breast milk and gently massage that milk into your nipples and allow the milk to air dry.  In the first few weeks after giving birth, some women experience extremely full breasts (engorgement). Engorgement can make your   breasts feel heavy, warm, and tender to the touch. Engorgement peaks within 3-5 days after you give birth. The following recommendations can help ease engorgement:  Completely empty your breasts while breastfeeding or pumping. You may want to start by applying warm, moist heat (in the shower or with warm water-soaked hand towels) just before feeding or pumping. This increases circulation and helps the milk flow. If your baby does not completely empty your breasts while breastfeeding, pump any extra milk after he or she is finished.  Wear a snug bra (nursing or regular) or tank top for 1-2 days to signal your body to slightly decrease milk production.  Apply ice packs to your breasts, unless this is too uncomfortable for you.  Make sure that your baby is latched on and positioned properly while breastfeeding.  If engorgement persists after 48 hours of following these recommendations, contact your health care provider or a lactation consultant. Overall health care recommendations while breastfeeding  Eat healthy foods. Alternate between meals and snacks, eating 3 of each per day. Because what you eat affects your breast milk, some of the foods may make your baby more irritable than usual. Avoid eating these foods if you are sure that they are negatively affecting your baby.  Drink milk, fruit juice, and water to  satisfy your thirst (about 10 glasses a day).  Rest often, relax, and continue to take your prenatal vitamins to prevent fatigue, stress, and anemia.  Continue breast self-awareness checks.  Avoid chewing and smoking tobacco. Chemicals from cigarettes that pass into breast milk and exposure to secondhand smoke may harm your baby.  Avoid alcohol and drug use, including marijuana. Some medicines that may be harmful to your baby can pass through breast milk. It is important to ask your health care provider before taking any medicine, including all over-the-counter and prescription medicine as well as vitamin and herbal supplements. It is possible to become pregnant while breastfeeding. If birth control is desired, ask your health care provider about options that will be safe for your baby. Contact a health care provider if:  You feel like you want to stop breastfeeding or have become frustrated with breastfeeding.  You have painful breasts or nipples.  Your nipples are cracked or bleeding.  Your breasts are red, tender, or warm.  You have a swollen area on either breast.  You have a fever or chills.  You have nausea or vomiting.  You have drainage other than breast milk from your nipples.  Your breasts do not become full before feedings by the fifth day after you give birth.  You feel sad and depressed.  Your baby is too sleepy to eat well.  Your baby is having trouble sleeping.  Your baby is wetting less than 3 diapers in a 24-hour period.  Your baby has less than 3 stools in a 24-hour period.  Your baby's skin or the white part of his or her eyes becomes yellow.  Your baby is not gaining weight by 5 days of age. Get help right away if:  Your baby is overly tired (lethargic) and does not want to wake up and feed.  Your baby develops an unexplained fever. This information is not intended to replace advice given to you by your health care provider. Make sure you discuss  any questions you have with your health care provider. Document Released: 05/05/2005 Document Revised: 10/17/2015 Document Reviewed: 10/27/2012 Elsevier Interactive Patient Education  2017 [employer]  

## 2016-08-05 NOTE — Progress Notes (Signed)
   PRENATAL VISIT NOTE  Subjective:  Sabrina Sanchez is a 26 y.o. G4P3003 at 1853w1d being seen today for ongoing prenatal care.  She is currently monitored for the following issues for this high-risk pregnancy and has Triplet gestation; Supervision of high-risk pregnancy; and Left inguinal hernia on her problem list.  Patient reports occasional contractions.  Contractions: Irritability. Vag. Bleeding: None.  Movement: Present. Denies leaking of fluid.   The following portions of the patient's history were reviewed and updated as appropriate: allergies, current medications, past family history, past medical history, past social history, past surgical history and problem list. Problem list updated.  Objective:   Vitals:   08/05/16 0941  BP: 110/67  Pulse: (!) 109  Weight: 194 lb (88 kg)    Fetal Status: Fetal Heart Rate (bpm): 150/145/148   Movement: Present     General:  Alert, oriented and cooperative. Patient is in no acute distress.  Skin: Skin is warm and dry. No rash noted.   Cardiovascular: Normal heart rate noted  Respiratory: Normal respiratory effort, no problems with respiration noted  Abdomen: Soft, gravid, appropriate for gestational age. Pain/Pressure: Present     Pelvic:  Cervical exam deferred        Extremities: Normal range of motion.     Mental Status: Normal mood and affect. Normal behavior. Normal judgment and thought content.  U/S 2/24 A-trv, 735 gms B-breech, 668 gms C-variable, 680 gms Cervix 4.1 cm All with normal fluid Assessment and Plan:  Pregnancy: G4P3003 at 5953w1d  1. Supervision of high risk pregnancy in second trimester 28 wk labs today She declines TDaP today--ask again - CBC - Glucose Tolerance, 2 Hours w/1 Hour - RPR - HIV antibody  2. TC/TA triplet pregnancy Has u/s for growth next week.  Preterm labor symptoms and general obstetric precautions including but not limited to vaginal bleeding, contractions, leaking of fluid and fetal movement  were reviewed in detail with the patient. Please refer to After Visit Summary for other counseling recommendations.  Return in 2 weeks (on 08/19/2016).   Reva Boresanya S Tannen Vandezande, MD

## 2016-08-06 LAB — CBC
Hematocrit: 31.7 % — ABNORMAL LOW (ref 34.0–46.6)
Hemoglobin: 10 g/dL — ABNORMAL LOW (ref 11.1–15.9)
MCH: 24.5 pg — ABNORMAL LOW (ref 26.6–33.0)
MCHC: 31.5 g/dL (ref 31.5–35.7)
MCV: 78 fL — ABNORMAL LOW (ref 79–97)
Platelets: 236 10*3/uL (ref 150–379)
RBC: 4.08 x10E6/uL (ref 3.77–5.28)
RDW: 15.2 % (ref 12.3–15.4)
WBC: 9.4 10*3/uL (ref 3.4–10.8)

## 2016-08-06 LAB — GLUCOSE TOLERANCE, 2 HOURS W/ 1HR
GLUCOSE, 1 HOUR: 119 mg/dL (ref 65–179)
GLUCOSE, FASTING: 71 mg/dL (ref 65–91)
Glucose, 2 hour: 133 mg/dL (ref 65–152)

## 2016-08-06 LAB — RPR: RPR Ser Ql: NONREACTIVE

## 2016-08-06 LAB — HIV ANTIBODY (ROUTINE TESTING W REFLEX): HIV SCREEN 4TH GENERATION: NONREACTIVE

## 2016-08-12 ENCOUNTER — Other Ambulatory Visit (HOSPITAL_COMMUNITY): Payer: Self-pay | Admitting: *Deleted

## 2016-08-12 ENCOUNTER — Other Ambulatory Visit (HOSPITAL_COMMUNITY): Payer: Self-pay | Admitting: Maternal and Fetal Medicine

## 2016-08-12 ENCOUNTER — Encounter (HOSPITAL_COMMUNITY): Payer: Self-pay

## 2016-08-12 ENCOUNTER — Ambulatory Visit (HOSPITAL_COMMUNITY)
Admission: RE | Admit: 2016-08-12 | Discharge: 2016-08-12 | Disposition: A | Discharge status: Home / Self Care | Payer: BLUE CROSS/BLUE SHIELD | Source: Ambulatory Visit | Attending: Obstetrics & Gynecology | Admitting: Obstetrics & Gynecology

## 2016-08-12 DIAGNOSIS — O30109 Triplet pregnancy, unspecified number of placenta and unspecified number of amniotic sacs, unspecified trimester: Secondary | ICD-10-CM

## 2016-08-12 DIAGNOSIS — Z3A28 28 weeks gestation of pregnancy: Secondary | ICD-10-CM

## 2016-08-12 DIAGNOSIS — O30013 Twin pregnancy, monochorionic/monoamniotic, third trimester: Secondary | ICD-10-CM | POA: Diagnosis not present

## 2016-08-12 DIAGNOSIS — Z362 Encounter for other antenatal screening follow-up: Secondary | ICD-10-CM

## 2016-08-12 DIAGNOSIS — O30103 Triplet pregnancy, unspecified number of placenta and unspecified number of amniotic sacs, third trimester: Secondary | ICD-10-CM

## 2016-08-12 DIAGNOSIS — O365920 Maternal care for other known or suspected poor fetal growth, second trimester, not applicable or unspecified: Secondary | ICD-10-CM

## 2016-08-13 ENCOUNTER — Other Ambulatory Visit (HOSPITAL_COMMUNITY): Payer: Self-pay | Admitting: *Deleted

## 2016-08-13 DIAGNOSIS — O30103 Triplet pregnancy, unspecified number of placenta and unspecified number of amniotic sacs, third trimester: Secondary | ICD-10-CM

## 2016-08-20 ENCOUNTER — Ambulatory Visit (INDEPENDENT_AMBULATORY_CARE_PROVIDER_SITE_OTHER): Payer: BLUE CROSS/BLUE SHIELD | Admitting: Obstetrics & Gynecology

## 2016-08-20 VITALS — BP 106/69 | HR 112 | Wt 199.0 lb

## 2016-08-20 DIAGNOSIS — O30103 Triplet pregnancy, unspecified number of placenta and unspecified number of amniotic sacs, third trimester: Secondary | ICD-10-CM

## 2016-08-20 DIAGNOSIS — O0992 Supervision of high risk pregnancy, unspecified, second trimester: Secondary | ICD-10-CM

## 2016-08-20 NOTE — Progress Notes (Signed)
   PRENATAL VISIT NOTE  Subjective:  Sabrina Sanchez is a 26 y.o. G4P3003 at [redacted]w[redacted]d being seen today for ongoing prenatal care.  She is currently monitored for the following issues for this high-risk pregnancy and has Triplet gestation; Supervision of high-risk pregnancy; and Left inguinal hernia on her problem list.  Patient reports no complaints.  Contractions: Irritability. Vag. Bleeding: None.  Movement: Present. Denies leaking of fluid.   The following portions of the patient's history were reviewed and updated as appropriate: allergies, current medications, past family history, past medical history, past social history, past surgical history and problem list. Problem list updated.  Objective:   Vitals:   08/20/16 1027  BP: 106/69  Pulse: (!) 112  Weight: 199 lb (90.3 kg)    Fetal Status: Fetal Heart Rate (bpm): 152/145/146   Movement: Present     General:  Alert, oriented and cooperative. Patient is in no acute distress.  Skin: Skin is warm and dry. No rash noted.   Cardiovascular: Normal heart rate noted  Respiratory: Normal respiratory effort, no problems with respiration noted  Abdomen: Soft, gravid, appropriate for gestational age. Pain/Pressure: Present     Pelvic:  Cervical exam deferred        Extremities: Normal range of motion.  Edema: None  Mental Status: Normal mood and affect. Normal behavior. Normal judgment and thought content.   Assessment and Plan:  Pregnancy: G4P3003 at [redacted]w[redacted]d She has a multitude of questions regarding her birthplan that I am unable to answer. I have suggested that she speak with Dr. Shawnie Pons (as the head of our dept) at her next visit.  There are no diagnoses linked to this encounter. Preterm labor symptoms and general obstetric precautions including but not limited to vaginal bleeding, contractions, leaking of fluid and fetal movement were reviewed in detail with the patient. Please refer to After Visit Summary for other counseling recommendations.    No Follow-up on file.   Allie Bossier, MD

## 2016-08-26 ENCOUNTER — Ambulatory Visit (INDEPENDENT_AMBULATORY_CARE_PROVIDER_SITE_OTHER): Payer: BLUE CROSS/BLUE SHIELD | Admitting: Family Medicine

## 2016-08-26 DIAGNOSIS — O30103 Triplet pregnancy, unspecified number of placenta and unspecified number of amniotic sacs, third trimester: Secondary | ICD-10-CM

## 2016-08-26 DIAGNOSIS — O30109 Triplet pregnancy, unspecified number of placenta and unspecified number of amniotic sacs, unspecified trimester: Secondary | ICD-10-CM

## 2016-08-26 DIAGNOSIS — O0943 Supervision of pregnancy with grand multiparity, third trimester: Secondary | ICD-10-CM

## 2016-08-26 DIAGNOSIS — O0993 Supervision of high risk pregnancy, unspecified, third trimester: Secondary | ICD-10-CM

## 2016-08-26 NOTE — Progress Notes (Signed)
   PRENATAL VISIT NOTE  Subjective:  Sabrina Sanchez is a 26 y.o. G4P3003 at [redacted]w[redacted]d being seen today for ongoing prenatal care.  She is currently monitored for the following issues for this high-risk pregnancy and has Triplet gestation; Supervision of high-risk pregnancy; and Left inguinal hernia on her problem list.  Patient reports contractions since awhile.   .  .   . Denies leaking of fluid.   The following portions of the patient's history were reviewed and updated as appropriate: allergies, current medications, past family history, past medical history, past social history, past surgical history and problem list. Problem list updated.  Objective:  There were no vitals filed for this visit.  Fetal Status:           General:  Alert, oriented and cooperative. Patient is in no acute distress.  Skin: Skin is warm and dry. No rash noted.   Cardiovascular: Normal heart rate noted  Respiratory: Normal respiratory effort, no problems with respiration noted  Abdomen: Soft, gravid, appropriate for gestational age.       Pelvic:  Cervical exam deferred        Extremities: Normal range of motion.     Mental Status: Normal mood and affect. Normal behavior. Normal judgment and thought content.   Assessment and Plan:  Pregnancy: G4P3003 at [redacted]w[redacted]d  1. Triplet gestation Appropriate fetal growth Desires to remain pregnant until 36 wks, assuming fetal testing is normal To begin weekly BPPs at 32 wks Still strongly desires attempt at vaginal delivery but only is baby A is vtx.  2. Supervision of high risk pregnancy in third trimester   Preterm labor symptoms and general obstetric precautions including but not limited to vaginal bleeding, contractions, leaking of fluid and fetal movement were reviewed in detail with the patient. Please refer to After Visit Summary for other counseling recommendations.  No Follow-up on file.   Reva Bores, MD

## 2016-09-10 ENCOUNTER — Ambulatory Visit (HOSPITAL_COMMUNITY)
Admission: RE | Admit: 2016-09-10 | Discharge: 2016-09-10 | Disposition: A | Discharge status: Home / Self Care | Payer: BLUE CROSS/BLUE SHIELD | Source: Ambulatory Visit | Attending: Obstetrics & Gynecology | Admitting: Obstetrics & Gynecology

## 2016-09-10 ENCOUNTER — Encounter (HOSPITAL_COMMUNITY): Payer: Self-pay

## 2016-09-10 DIAGNOSIS — Z362 Encounter for other antenatal screening follow-up: Secondary | ICD-10-CM | POA: Diagnosis not present

## 2016-09-10 DIAGNOSIS — Z3A32 32 weeks gestation of pregnancy: Secondary | ICD-10-CM | POA: Insufficient documentation

## 2016-09-10 DIAGNOSIS — O30103 Triplet pregnancy, unspecified number of placenta and unspecified number of amniotic sacs, third trimester: Secondary | ICD-10-CM | POA: Insufficient documentation

## 2016-09-10 DIAGNOSIS — O365920 Maternal care for other known or suspected poor fetal growth, second trimester, not applicable or unspecified: Secondary | ICD-10-CM

## 2016-09-17 ENCOUNTER — Other Ambulatory Visit (HOSPITAL_COMMUNITY): Payer: Self-pay | Admitting: Maternal and Fetal Medicine

## 2016-09-17 ENCOUNTER — Ambulatory Visit (HOSPITAL_COMMUNITY)
Admission: RE | Admit: 2016-09-17 | Discharge: 2016-09-17 | Disposition: A | Discharge status: Home / Self Care | Payer: BLUE CROSS/BLUE SHIELD | Source: Ambulatory Visit | Attending: Obstetrics & Gynecology | Admitting: Obstetrics & Gynecology

## 2016-09-17 ENCOUNTER — Encounter (HOSPITAL_COMMUNITY): Payer: Self-pay

## 2016-09-17 DIAGNOSIS — O30103 Triplet pregnancy, unspecified number of placenta and unspecified number of amniotic sacs, third trimester: Secondary | ICD-10-CM

## 2016-09-17 DIAGNOSIS — O0993 Supervision of high risk pregnancy, unspecified, third trimester: Secondary | ICD-10-CM

## 2016-09-17 DIAGNOSIS — Z3A33 33 weeks gestation of pregnancy: Secondary | ICD-10-CM | POA: Insufficient documentation

## 2016-09-17 DIAGNOSIS — O321XX1 Maternal care for breech presentation, fetus 1: Secondary | ICD-10-CM | POA: Insufficient documentation

## 2016-09-17 DIAGNOSIS — O365920 Maternal care for other known or suspected poor fetal growth, second trimester, not applicable or unspecified: Secondary | ICD-10-CM

## 2016-09-17 DIAGNOSIS — O30113 Triplet pregnancy with two or more monochorionic fetuses, third trimester: Secondary | ICD-10-CM | POA: Diagnosis not present

## 2016-09-19 ENCOUNTER — Ambulatory Visit (INDEPENDENT_AMBULATORY_CARE_PROVIDER_SITE_OTHER): Payer: BLUE CROSS/BLUE SHIELD | Admitting: Family Medicine

## 2016-09-19 VITALS — BP 101/70 | HR 109 | Wt 210.0 lb

## 2016-09-19 DIAGNOSIS — O0993 Supervision of high risk pregnancy, unspecified, third trimester: Secondary | ICD-10-CM

## 2016-09-19 DIAGNOSIS — O0943 Supervision of pregnancy with grand multiparity, third trimester: Secondary | ICD-10-CM | POA: Diagnosis not present

## 2016-09-19 DIAGNOSIS — O30103 Triplet pregnancy, unspecified number of placenta and unspecified number of amniotic sacs, third trimester: Secondary | ICD-10-CM | POA: Diagnosis not present

## 2016-09-19 DIAGNOSIS — O30109 Triplet pregnancy, unspecified number of placenta and unspecified number of amniotic sacs, unspecified trimester: Secondary | ICD-10-CM

## 2016-09-19 NOTE — Progress Notes (Signed)
   PRENATAL VISIT NOTE  Subjective:  Sabrina Sanchez is a 26 y.o. G4P3003 at 6243w4d being seen today for ongoing prenatal care.  She is currently monitored for the following issues for this high-risk pregnancy and has Triplet gestation; Supervision of high-risk pregnancy; and Left inguinal hernia on her problem list.  Patient reports backache and contractions since a few weeks, occasionally but not regular.  Contractions: Irregular. Vag. Bleeding: None.  Movement: Present. Denies leaking of fluid.   The following portions of the patient's history were reviewed and updated as appropriate: allergies, current medications, past family history, past medical history, past social history, past surgical history and problem list. Problem list updated.  Objective:   Vitals:   09/19/16 1042  BP: 101/70  Pulse: (!) 109  Weight: 210 lb (95.3 kg)    Fetal Status: Fetal Heart Rate (bpm): 152/158/175   Movement: Present     General:  Alert, oriented and cooperative. Patient is in no acute distress.  Skin: Skin is warm and dry. No rash noted.   Cardiovascular: Normal heart rate noted  Respiratory: Normal respiratory effort, no problems with respiration noted  Abdomen: Soft, gravid, appropriate for gestational age. Pain/Pressure: Present     Pelvic:  Cervical exam deferred        Extremities: Normal range of motion.  Edema: None  Mental Status: Normal mood and affect. Normal behavior. Normal judgment and thought content.   Assessment and Plan:  Pregnancy: G4P3003 at 2443w4d  1. Triplet gestation TC/TA gestation. Appropriate growth Weekly BPP Discussed risks of vaginal birth  - [place] bedside  2. Supervision of high risk pregnancy in third trimester Continue prenatal care.   Preterm labor symptoms and general obstetric precautions including but not limited to vaginal bleeding, contractions, leaking of fluid and fetal movement were reviewed in detail with the patient. Please refer to After Visit Summary  for other counseling recommendations.  Return in 2 weeks (on 10/03/2016).   Reva Boresanya S Donnette Macmullen, MD

## 2016-09-19 NOTE — Patient Instructions (Signed)
 Third Trimester of Pregnancy The third trimester is from week 28 through week 40 (months 7 through 9). The third trimester is a time when the unborn baby (fetus) is growing rapidly. At the end of the ninth month, the fetus is about 20 inches in length and weighs 6-10 pounds. Body changes during your third trimester Your body will continue to go through many changes during pregnancy. The changes vary from woman to woman. During the third trimester:  Your weight will continue to increase. You can expect to gain 25-35 pounds (11-16 kg) by the end of the pregnancy.  You may begin to get stretch marks on your hips, abdomen, and breasts.  You may urinate more often because the fetus is moving lower into your pelvis and pressing on your bladder.  You may develop or continue to have heartburn. This is caused by increased hormones that slow down muscles in the digestive tract.  You may develop or continue to have constipation because increased hormones slow digestion and cause the muscles that push waste through your intestines to relax.  You may develop hemorrhoids. These are swollen veins (varicose veins) in the rectum that can itch or be painful.  You may develop swollen, bulging veins (varicose veins) in your legs.  You may have increased body aches in the pelvis, back, or thighs. This is due to weight gain and increased hormones that are relaxing your joints.  You may have changes in your hair. These can include thickening of your hair, rapid growth, and changes in texture. Some women also have hair loss during or after pregnancy, or hair that feels dry or thin. Your hair will most likely return to normal after your baby is born.  Your breasts will continue to grow and they will continue to become tender. A yellow fluid (colostrum) may leak from your breasts. This is the first milk you are producing for your baby.  Your belly button may stick out.  You may notice more swelling in your  hands, face, or ankles.  You may have increased tingling or numbness in your hands, arms, and legs. The skin on your belly may also feel numb.  You may feel short of breath because of your expanding uterus.  You may have more problems sleeping. This can be caused by the size of your belly, increased need to urinate, and an increase in your body's metabolism.  You may notice the fetus "dropping," or moving lower in your abdomen (lightening).  You may have increased vaginal discharge.  You may notice your joints feel loose and you may have pain around your pelvic bone.  What to expect at prenatal visits You will have prenatal exams every 2 weeks until week 36. Then you will have weekly prenatal exams. During a routine prenatal visit:  You will be weighed to make sure you and the baby are growing normally.  Your blood pressure will be taken.  Your abdomen will be measured to track your baby's growth.  The fetal heartbeat will be listened to.  Any test results from the previous visit will be discussed.  You may have a cervical check near your due date to see if your cervix has softened or thinned (effaced).  You will be tested for Group B streptococcus. This happens between 35 and 37 weeks.  Your health care provider may ask you:  What your birth plan is.  How you are feeling.  If you are feeling the baby move.  If you have   had any abnormal symptoms, such as leaking fluid, bleeding, severe headaches, or abdominal cramping.  If you are using any tobacco products, including cigarettes, chewing tobacco, and electronic cigarettes.  If you have any questions.  Other tests or screenings that may be performed during your third trimester include:  Blood tests that check for low iron levels (anemia).  Fetal testing to check the health, activity level, and growth of the fetus. Testing is done if you have certain medical conditions or if there are problems during the  pregnancy.  Nonstress test (NST). This test checks the health of your baby to make sure there are no signs of problems, such as the baby not getting enough oxygen. During this test, a belt is placed around your belly. The baby is made to move, and its heart rate is monitored during movement.  What is false labor? False labor is a condition in which you feel small, irregular tightenings of the muscles in the womb (contractions) that usually go away with rest, changing position, or drinking water. These are called Braxton Hicks contractions. Contractions may last for hours, days, or even weeks before true labor sets in. If contractions come at regular intervals, become more frequent, increase in intensity, or become painful, you should see your health care provider. What are the signs of labor?  Abdominal cramps.  Regular contractions that start at 10 minutes apart and become stronger and more frequent with time.  Contractions that start on the top of the uterus and spread down to the lower abdomen and back.  Increased pelvic pressure and dull back pain.  A watery or bloody mucus discharge that comes from the vagina.  Leaking of amniotic fluid. This is also known as your "water breaking." It could be a slow trickle or a gush. Let your health care provider know if it has a color or strange odor. If you have any of these signs, call your health care provider right away, even if it is before your due date. Follow these instructions at home: Medicines  Follow your health care provider's instructions regarding medicine use. Specific medicines may be either safe or unsafe to take during pregnancy.  Take a prenatal vitamin that contains at least 600 micrograms (mcg) of folic acid.  If you develop constipation, try taking a stool softener if your health care provider approves. Eating and drinking  Eat a balanced diet that includes fresh fruits and vegetables, whole grains, good sources of protein  such as meat, eggs, or tofu, and low-fat dairy. Your health care provider will help you determine the amount of weight gain that is right for you.  Avoid raw meat and uncooked cheese. These carry germs that can cause birth defects in the baby.  If you have low calcium intake from food, talk to your health care provider about whether you should take a daily calcium supplement.  Eat four or five small meals rather than three large meals a day.  Limit foods that are high in fat and processed sugars, such as fried and sweet foods.  To prevent constipation: ? Drink enough fluid to keep your urine clear or pale yellow. ? Eat foods that are high in fiber, such as fresh fruits and vegetables, whole grains, and beans. Activity  Exercise only as directed by your health care provider. Most women can continue their usual exercise routine during pregnancy. Try to exercise for 30 minutes at least 5 days a week. Stop exercising if you experience uterine contractions.  Avoid   heavy lifting.  Do not exercise in extreme heat or humidity, or at high altitudes.  Wear low-heel, comfortable shoes.  Practice good posture.  You may continue to have sex unless your health care provider tells you otherwise. Relieving pain and discomfort  Take frequent breaks and rest with your legs elevated if you have leg cramps or low back pain.  Take warm sitz baths to soothe any pain or discomfort caused by hemorrhoids. Use hemorrhoid cream if your health care provider approves.  Wear a good support bra to prevent discomfort from breast tenderness.  If you develop varicose veins: ? Wear support pantyhose or compression stockings as told by your healthcare provider. ? Elevate your feet for 15 minutes, 3-4 times a day. Prenatal care  Write down your questions. Take them to your prenatal visits.  Keep all your prenatal visits as told by your health care provider. This is important. Safety  Wear your seat belt at  all times when driving.  Make a list of emergency phone numbers, including numbers for family, friends, the hospital, and police and fire departments. General instructions  Avoid cat litter boxes and soil used by cats. These carry germs that can cause birth defects in the baby. If you have a cat, ask someone to clean the litter box for you.  Do not travel far distances unless it is absolutely necessary and only with the approval of your health care provider.  Do not use hot tubs, steam rooms, or saunas.  Do not drink alcohol.  Do not use any products that contain nicotine or tobacco, such as cigarettes and e-cigarettes. If you need help quitting, ask your health care provider.  Do not use any medicinal herbs or unprescribed drugs. These chemicals affect the formation and growth of the baby.  Do not douche or use tampons or scented sanitary pads.  Do not cross your legs for long periods of time.  To prepare for the arrival of your baby: ? Take prenatal classes to understand, practice, and ask questions about labor and delivery. ? Make a trial run to the hospital. ? Visit the hospital and tour the maternity area. ? Arrange for maternity or paternity leave through employers. ? Arrange for family and friends to take care of pets while you are in the hospital. ? Purchase a rear-facing car seat and make sure you know how to install it in your car. ? Pack your hospital bag. ? Prepare the baby's nursery. Make sure to remove all pillows and stuffed animals from the baby's crib to prevent suffocation.  Visit your dentist if you have not gone during your pregnancy. Use a soft toothbrush to brush your teeth and be gentle when you floss. Contact a health care provider if:  You are unsure if you are in labor or if your water has broken.  You become dizzy.  You have mild pelvic cramps, pelvic pressure, or nagging pain in your abdominal area.  You have lower back pain.  You have persistent  nausea, vomiting, or diarrhea.  You have an unusual or bad smelling vaginal discharge.  You have pain when you urinate. Get help right away if:  Your water breaks before 37 weeks.  You have regular contractions less than 5 minutes apart before 37 weeks.  You have a fever.  You are leaking fluid from your vagina.  You have spotting or bleeding from your vagina.  You have severe abdominal pain or cramping.  You have rapid weight loss or weight   gain.  You have shortness of breath with chest pain.  You notice sudden or extreme swelling of your face, hands, ankles, feet, or legs.  Your baby makes fewer than 10 movements in 2 hours.  You have severe headaches that do not go away when you take medicine.  You have vision changes. Summary  The third trimester is from week 28 through week 40, months 7 through 9. The third trimester is a time when the unborn baby (fetus) is growing rapidly.  During the third trimester, your discomfort may increase as you and your baby continue to gain weight. You may have abdominal, leg, and back pain, sleeping problems, and an increased need to urinate.  During the third trimester your breasts will keep growing and they will continue to become tender. A yellow fluid (colostrum) may leak from your breasts. This is the first milk you are producing for your baby.  False labor is a condition in which you feel small, irregular tightenings of the muscles in the womb (contractions) that eventually go away. These are called Braxton Hicks contractions. Contractions may last for hours, days, or even weeks before true labor sets in.  Signs of labor can include: abdominal cramps; regular contractions that start at 10 minutes apart and become stronger and more frequent with time; watery or bloody mucus discharge that comes from the vagina; increased pelvic pressure and dull back pain; and leaking of amniotic fluid. This information is not intended to replace advice  given to you by your health care provider. Make sure you discuss any questions you have with your health care provider. Document Released: 04/29/2001 Document Revised: 10/11/2015 Document Reviewed: 07/06/2012 [employer] Interactive Patient Education  2017 [employer]   Breastfeeding Deciding to breastfeed is one of the best choices you can make for you and your baby. A change in hormones during pregnancy causes your breast tissue to grow and increases the number and size of your milk ducts. These hormones also allow proteins, sugars, and fats from your blood supply to make breast milk in your milk-producing glands. Hormones prevent breast milk from being released before your baby is born as well as prompt milk flow after birth. Once breastfeeding has begun, thoughts of your baby, as well as his or her sucking or crying, can stimulate the release of milk from your milk-producing glands. Benefits of breastfeeding For Your Baby  Your first milk (colostrum) helps your baby's digestive system function better.  There are antibodies in your milk that help your baby fight off infections.  Your baby has a lower incidence of asthma, allergies, and sudden infant death syndrome.  The nutrients in breast milk are better for your baby than infant formulas and are designed uniquely for your baby's needs.  Breast milk improves your baby's brain development.  Your baby is less likely to develop other conditions, such as childhood obesity, asthma, or type 2 diabetes mellitus.  For You  Breastfeeding helps to create a very special bond between you and your baby.  Breastfeeding is convenient. Breast milk is always available at the correct temperature and costs nothing.  Breastfeeding helps to burn calories and helps you lose the weight gained during pregnancy.  Breastfeeding makes your uterus contract to its prepregnancy size faster and slows bleeding (lochia) after you give birth.  Breastfeeding helps  to lower your risk of developing type 2 diabetes mellitus, osteoporosis, and breast or ovarian cancer later in life.  Signs that your baby is hungry Early Signs of Hunger    Increased alertness or activity.  Stretching.  Movement of the head from side to side.  Movement of the head and opening of the mouth when the corner of the mouth or cheek is stroked (rooting).  Increased sucking sounds, smacking lips, cooing, sighing, or squeaking.  Hand-to-mouth movements.  Increased sucking of fingers or hands.  Late Signs of Hunger  Fussing.  Intermittent crying.  Extreme Signs of Hunger Signs of extreme hunger will require calming and consoling before your baby will be able to breastfeed successfully. Do not wait for the following signs of extreme hunger to occur before you initiate breastfeeding:  Restlessness.  A loud, strong cry.  Screaming.  Breastfeeding basics Breastfeeding Initiation  Find a comfortable place to sit or lie down, with your neck and back well supported.  Place a pillow or rolled up blanket under your baby to bring him or her to the level of your breast (if you are seated). Nursing pillows are specially designed to help support your arms and your baby while you breastfeed.  Make sure that your baby's abdomen is facing your abdomen.  Gently massage your breast. With your fingertips, massage from your chest wall toward your nipple in a circular motion. This encourages milk flow. You may need to continue this action during the feeding if your milk flows slowly.  Support your breast with 4 fingers underneath and your thumb above your nipple. Make sure your fingers are well away from your nipple and your baby's mouth.  Stroke your baby's lips gently with your finger or nipple.  When your baby's mouth is open wide enough, quickly bring your baby to your breast, placing your entire nipple and as much of the colored area around your nipple (areola) as possible into  your baby's mouth. ? More areola should be visible above your baby's upper lip than below the lower lip. ? Your baby's tongue should be between his or her lower gum and your breast.  Ensure that your baby's mouth is correctly positioned around your nipple (latched). Your baby's lips should create a seal on your breast and be turned out (everted).  It is common for your baby to suck about 2-3 minutes in order to start the flow of breast milk.  Latching Teaching your baby how to latch on to your breast properly is very important. An improper latch can cause nipple pain and decreased milk supply for you and poor weight gain in your baby. Also, if your baby is not latched onto your nipple properly, he or she may swallow some air during feeding. This can make your baby fussy. Burping your baby when you switch breasts during the feeding can help to get rid of the air. However, teaching your baby to latch on properly is still the best way to prevent fussiness from swallowing air while breastfeeding. Signs that your baby has successfully latched on to your nipple:  Silent tugging or silent sucking, without causing you pain.  Swallowing heard between every 3-4 sucks.  Muscle movement above and in front of his or her ears while sucking.  Signs that your baby has not successfully latched on to nipple:  Sucking sounds or smacking sounds from your baby while breastfeeding.  Nipple pain.  If you think your baby has not latched on correctly, slip your finger into the corner of your baby's mouth to break the suction and place it between your baby's gums. Attempt breastfeeding initiation again. Signs of Successful Breastfeeding Signs from your baby:  A   gradual decrease in the number of sucks or complete cessation of sucking.  Falling asleep.  Relaxation of his or her body.  Retention of a small amount of milk in his or her mouth.  Letting go of your breast by himself or herself.  Signs from  you:  Breasts that have increased in firmness, weight, and size 1-3 hours after feeding.  Breasts that are softer immediately after breastfeeding.  Increased milk volume, as well as a change in milk consistency and color by the fifth day of breastfeeding.  Nipples that are not sore, cracked, or bleeding.  Signs That Your Baby is Getting Enough Milk  Wetting at least 1-2 diapers during the first 24 hours after birth.  Wetting at least 5-6 diapers every 24 hours for the first week after birth. The urine should be clear or pale yellow by 5 days after birth.  Wetting 6-8 diapers every 24 hours as your baby continues to grow and develop.  At least 3 stools in a 24-hour period by age 5 days. The stool should be soft and yellow.  At least 3 stools in a 24-hour period by age 7 days. The stool should be seedy and yellow.  No loss of weight greater than 10% of birth weight during the first 3 days of age.  Average weight gain of 4-7 ounces (113-198 g) per week after age 4 days.  Consistent daily weight gain by age 5 days, without weight loss after the age of 2 weeks.  After a feeding, your baby may spit up a small amount. This is common. Breastfeeding frequency and duration Frequent feeding will help you make more milk and can prevent sore nipples and breast engorgement. Breastfeed when you feel the need to reduce the fullness of your breasts or when your baby shows signs of hunger. This is called "breastfeeding on demand." Avoid introducing a pacifier to your baby while you are working to establish breastfeeding (the first 4-6 weeks after your baby is born). After this time you may choose to use a pacifier. Research has shown that pacifier use during the first year of a baby's life decreases the risk of sudden infant death syndrome (SIDS). Allow your baby to feed on each breast as long as he or she wants. Breastfeed until your baby is finished feeding. When your baby unlatches or falls asleep  while feeding from the first breast, offer the second breast. Because newborns are often sleepy in the first few weeks of life, you may need to awaken your baby to get him or her to feed. Breastfeeding times will vary from baby to baby. However, the following rules can serve as a guide to help you ensure that your baby is properly fed:  Newborns (babies 4 weeks of age or younger) may breastfeed every 1-3 hours.  Newborns should not go longer than 3 hours during the day or 5 hours during the night without breastfeeding.  You should breastfeed your baby a minimum of 8 times in a 24-hour period until you begin to introduce solid foods to your baby at around 6 months of age.  Breast milk pumping Pumping and storing breast milk allows you to ensure that your baby is exclusively fed your breast milk, even at times when you are unable to breastfeed. This is especially important if you are going back to work while you are still breastfeeding or when you are not able to be present during feedings. Your lactation consultant can give you guidelines on how   long it is safe to store breast milk. A breast pump is a machine that allows you to pump milk from your breast into a sterile bottle. The pumped breast milk can then be stored in a refrigerator or freezer. Some breast pumps are operated by hand, while others use electricity. Ask your lactation consultant which type will work best for you. Breast pumps can be purchased, but some hospitals and breastfeeding support groups lease breast pumps on a monthly basis. A lactation consultant can teach you how to hand express breast milk, if you prefer not to use a pump. Caring for your breasts while you breastfeed Nipples can become dry, cracked, and sore while breastfeeding. The following recommendations can help keep your breasts moisturized and healthy:  Avoid using soap on your nipples.  Wear a supportive bra. Although not required, special nursing bras and tank  tops are designed to allow access to your breasts for breastfeeding without taking off your entire bra or top. Avoid wearing underwire-style bras or extremely tight bras.  Air dry your nipples for 3-4minutes after each feeding.  Use only cotton bra pads to absorb leaked breast milk. Leaking of breast milk between feedings is normal.  Use lanolin on your nipples after breastfeeding. Lanolin helps to maintain your skin's normal moisture barrier. If you use pure lanolin, you do not need to wash it off before feeding your baby again. Pure lanolin is not toxic to your baby. You may also hand express a few drops of breast milk and gently massage that milk into your nipples and allow the milk to air dry.  In the first few weeks after giving birth, some women experience extremely full breasts (engorgement). Engorgement can make your breasts feel heavy, warm, and tender to the touch. Engorgement peaks within 3-5 days after you give birth. The following recommendations can help ease engorgement:  Completely empty your breasts while breastfeeding or pumping. You may want to start by applying warm, moist heat (in the shower or with warm water-soaked hand towels) just before feeding or pumping. This increases circulation and helps the milk flow. If your baby does not completely empty your breasts while breastfeeding, pump any extra milk after he or she is finished.  Wear a snug bra (nursing or regular) or tank top for 1-2 days to signal your body to slightly decrease milk production.  Apply ice packs to your breasts, unless this is too uncomfortable for you.  Make sure that your baby is latched on and positioned properly while breastfeeding.  If engorgement persists after 48 hours of following these recommendations, contact your health care provider or a lactation consultant. Overall health care recommendations while breastfeeding  Eat healthy foods. Alternate between meals and snacks, eating 3 of each per  day. Because what you eat affects your breast milk, some of the foods may make your baby more irritable than usual. Avoid eating these foods if you are sure that they are negatively affecting your baby.  Drink milk, fruit juice, and water to satisfy your thirst (about 10 glasses a day).  Rest often, relax, and continue to take your prenatal vitamins to prevent fatigue, stress, and anemia.  Continue breast self-awareness checks.  Avoid chewing and smoking tobacco. Chemicals from cigarettes that pass into breast milk and exposure to secondhand smoke may harm your baby.  Avoid alcohol and drug use, including marijuana. Some medicines that may be harmful to your baby can pass through breast milk. It is important to ask your health care   provider before taking any medicine, including all over-the-counter and prescription medicine as well as vitamin and herbal supplements. It is possible to become pregnant while breastfeeding. If birth control is desired, ask your health care provider about options that will be safe for your baby. Contact a health care provider if:  You feel like you want to stop breastfeeding or have become frustrated with breastfeeding.  You have painful breasts or nipples.  Your nipples are cracked or bleeding.  Your breasts are red, tender, or warm.  You have a swollen area on either breast.  You have a fever or chills.  You have nausea or vomiting.  You have drainage other than breast milk from your nipples.  Your breasts do not become full before feedings by the fifth day after you give birth.  You feel sad and depressed.  Your baby is too sleepy to eat well.  Your baby is having trouble sleeping.  Your baby is wetting less than 3 diapers in a 24-hour period.  Your baby has less than 3 stools in a 24-hour period.  Your baby's skin or the white part of his or her eyes becomes yellow.  Your baby is not gaining weight by 5 days of age. Get help right away  if:  Your baby is overly tired (lethargic) and does not want to wake up and feed.  Your baby develops an unexplained fever. This information is not intended to replace advice given to you by your health care provider. Make sure you discuss any questions you have with your health care provider. Document Released: 05/05/2005 Document Revised: 10/17/2015 Document Reviewed: 10/27/2012 Elsevier Interactive Patient Education  2017 Elsevier Inc.  

## 2016-09-22 ENCOUNTER — Telehealth: Payer: Self-pay | Admitting: *Deleted

## 2016-09-22 ENCOUNTER — Encounter (HOSPITAL_COMMUNITY): Payer: Self-pay

## 2016-09-22 ENCOUNTER — Inpatient Hospital Stay (HOSPITAL_COMMUNITY)
Admission: AD | Admit: 2016-09-22 | Discharge: 2016-09-22 | Disposition: A | Discharge status: Home / Self Care | Payer: BLUE CROSS/BLUE SHIELD | Source: Ambulatory Visit | Attending: Obstetrics & Gynecology | Admitting: Obstetrics & Gynecology

## 2016-09-22 DIAGNOSIS — O30103 Triplet pregnancy, unspecified number of placenta and unspecified number of amniotic sacs, third trimester: Secondary | ICD-10-CM | POA: Diagnosis not present

## 2016-09-22 DIAGNOSIS — Z3A34 34 weeks gestation of pregnancy: Secondary | ICD-10-CM | POA: Diagnosis not present

## 2016-09-22 DIAGNOSIS — O0943 Supervision of pregnancy with grand multiparity, third trimester: Secondary | ICD-10-CM | POA: Diagnosis not present

## 2016-09-22 DIAGNOSIS — O30109 Triplet pregnancy, unspecified number of placenta and unspecified number of amniotic sacs, unspecified trimester: Secondary | ICD-10-CM

## 2016-09-22 DIAGNOSIS — O0993 Supervision of high risk pregnancy, unspecified, third trimester: Secondary | ICD-10-CM | POA: Diagnosis not present

## 2016-09-22 DIAGNOSIS — O30101 Triplet pregnancy, unspecified number of placenta and unspecified number of amniotic sacs, first trimester: Secondary | ICD-10-CM | POA: Diagnosis not present

## 2016-09-22 DIAGNOSIS — O4703 False labor before 37 completed weeks of gestation, third trimester: Secondary | ICD-10-CM

## 2016-09-22 LAB — URINALYSIS, ROUTINE W REFLEX MICROSCOPIC
BILIRUBIN URINE: NEGATIVE
Glucose, UA: NEGATIVE mg/dL
HGB URINE DIPSTICK: NEGATIVE
Ketones, ur: NEGATIVE mg/dL
LEUKOCYTES UA: NEGATIVE
NITRITE: NEGATIVE
PROTEIN: NEGATIVE mg/dL
Specific Gravity, Urine: 1.003 — ABNORMAL LOW (ref 1.005–1.030)
pH: 6 (ref 5.0–8.0)

## 2016-09-22 NOTE — MAU Provider Note (Signed)
Chief Complaint:  Contractions   First Provider Initiated Contact with Patient 09/22/16 1440      HPI: Sabrina Sanchez is a 26 y.o. G4P3003 at 751w0d who presents to maternity admissions reporting cramping/contractions starting last night.  The contractions are irregular, but at least every 10-12 minutes since last night. She reports they are improved since coming to MAU. She tried drinking fluids and resting and was able to sleep last night but woke up with contractions continuing so she came in to be evaluated. There are no associated symptoms.   She reports good fetal movement, denies LOF, vaginal bleeding, vaginal itching/burning, urinary symptoms, h/a, dizziness, n/v, or fever/chills.    HPI  Past Medical History: Past Medical History:  Diagnosis Date  . Fracture of wrist AGE 38 AND AGE 59  . Infection    UTI X 1  . Infection    OCC YEAST    Past obstetric history: OB History  Gravida Para Term Preterm AB Living  4 3 3  0 0 3  SAB TAB Ectopic Multiple Live Births  0 0 0 0 3    # Outcome Date GA Lbr Len/2nd Weight Sex Delivery Anes PTL Lv  4 Current           3 Term 11/12/14 2477w6d 28:45 / 00:09 8 lb 4.5 oz (3.756 kg) M Vag-Spont Local  LIV  2 Term 03/02/13 7384w4d 05:50 / 00:13 7 lb 13.6 oz (3.561 kg) F Vag-Spont Local  LIV  1 Term 04/29/11 4528w6d 11:45 / 00:34 7 lb 14.6 oz (3.589 kg) F Vag-Spont None  LIV     Birth Comments: MECONIUM      Past Surgical History: Past Surgical History:  Procedure Laterality Date  . EYE SURGERY  AGE 26    Family History: Family History  Problem Relation Age of Onset  . Asthma Maternal Aunt   . Hyperlipidemia Maternal Grandmother   . Thrombophlebitis Maternal Grandmother   . Anesthesia problems Neg Hx     Social History: Social History  Substance Use Topics  . Smoking status: Never Smoker  . Smokeless tobacco: Never Used  . Alcohol use No    Allergies: No Known Allergies  Meds:  Prescriptions Prior to Admission  Medication Sig  Dispense Refill Last Dose  . ferrous sulfate 325 (65 FE) MG tablet Take 325 mg by mouth daily with breakfast.   09/22/2016 at Unknown time  . Prenatal Vit-Fe Fumarate-FA (PRENATAL MULTIVITAMIN) TABS tablet Take 1 tablet by mouth daily at 12 noon.   09/22/2016 at Unknown time    ROS:  Review of Systems  Constitutional: Negative for chills, fatigue and fever.  Eyes: Negative for visual disturbance.  Respiratory: Negative for shortness of breath.   Cardiovascular: Negative for chest pain.  Gastrointestinal: Positive for abdominal pain. Negative for nausea and vomiting.  Genitourinary: Positive for pelvic pain. Negative for difficulty urinating, dysuria, flank pain, vaginal bleeding, vaginal discharge and vaginal pain.  Neurological: Negative for dizziness and headaches.  Psychiatric/Behavioral: Negative.      I have reviewed patient's Past Medical Hx, Surgical Hx, Family Hx, Social Hx, medications and allergies.   Physical Exam  Patient Vitals for the past 24 hrs:  BP Temp Temp src Pulse Resp SpO2 Weight  09/22/16 1330 111/72 98.2 F (36.8 C) Oral (!) 108 18 100 % 212 lb 4 oz (96.3 kg)   Constitutional: Well-developed, well-nourished female in no acute distress.  Cardiovascular: normal rate Respiratory: normal effort GI: Abd soft, non-tender, gravid appropriate for  gestational age.  MS: Extremities nontender, no edema, normal ROM Neurologic: Alert and oriented x 4.  GU: Neg CVAT.  Dilation: Closed Effacement (%): Thick Cervical Position: Posterior Exam by:: Leftwich-Kirby CNM  FHT Baby A:  Baseline 145 , moderate variability, accelerations present, no decelerations FHT Baby B:  Baseline 145 , moderate variability, accelerations present, no decelerations FHT Baby C:  Baseline 145 , moderate variability, accelerations present, no decelerations Contractions: irregular, mild to palpation   Labs: Results for orders placed or performed during the hospital encounter of 09/22/16 (from  the past 24 hour(s))  Urinalysis, Routine w reflex microscopic     Status: Abnormal   Collection Time: 09/22/16  1:33 PM  Result Value Ref Range   Color, Urine STRAW (A) YELLOW   APPearance CLEAR CLEAR   Specific Gravity, Urine 1.003 (L) 1.005 - 1.030   pH 6.0 5.0 - 8.0   Glucose, UA NEGATIVE NEGATIVE mg/dL   Hgb urine dipstick NEGATIVE NEGATIVE   Bilirubin Urine NEGATIVE NEGATIVE   Ketones, ur NEGATIVE NEGATIVE mg/dL   Protein, ur NEGATIVE NEGATIVE mg/dL   Nitrite NEGATIVE NEGATIVE   Leukocytes, UA NEGATIVE NEGATIVE   B/POS/-- (12/18 1620)  Imaging:    MAU Course/MDM: I have ordered labs and reviewed results.  NST reviewed--reactive NST on Babies A, B, and C No evidence of PTL today with closed cervix Consult Dr Debroah Loop with presentation, exam findings and test results.  D/C home with labor precautions  F/U at [facility] and [facility] this week as scheduled Pt stable at time of discharge.   Assessment: 1. Triplet gestation   2. Supervision of high risk pregnancy in third trimester   3. Threatened preterm labor, third trimester     Plan: Discharge home Labor precautions and fetal kick counts  Follow-up Information    [facility] Follow up.   Specialty:  Maternal and Fetal Medicine Why:  As scheduled on Wednesday, 09/24/16. Contact information: [address] [id number] mc [place] [address] [phone]       [facility] Follow up.   Specialty:  Obstetrics and Gynecology Why:  As scheduled, return to MAU with signs of labor or emergencies Contact information: [address] [place] [address] [phone]         Allergies as of 09/22/2016   No Known Allergies     Medication List    TAKE these medications   ferrous sulfate 325 (65 FE) MG tablet Take 325 mg by mouth daily with breakfast.   prenatal multivitamin Tabs tablet Take 1 tablet by mouth daily at 12 noon.        Sharen Counter Certified Nurse-Midwife 09/22/2016 4:09 PM

## 2016-09-22 NOTE — MAU Note (Signed)
Hs been having contractions, since last night.   Had cleaned up this morning, fell asleep, when woke up, was still contracting.  ~ 10 min or less, lasting more than a min. No bleeding or leaking.

## 2016-09-22 NOTE — Telephone Encounter (Signed)
Pt called in stating she has had contractions more frequently but are irregular. She had them approx 7 minutes apart last night but then they slowed down. She is still having them today and would like to be placed on the monitor to check on babies. Adv pt to report to MAU for eval and monitoring. Pt expressed understanding.

## 2016-09-24 ENCOUNTER — Encounter (HOSPITAL_COMMUNITY): Payer: Self-pay

## 2016-09-24 ENCOUNTER — Ambulatory Visit (HOSPITAL_COMMUNITY)
Admission: RE | Admit: 2016-09-24 | Discharge: 2016-09-24 | Disposition: A | Discharge status: Home / Self Care | Payer: BLUE CROSS/BLUE SHIELD | Source: Ambulatory Visit | Attending: Obstetrics & Gynecology | Admitting: Obstetrics & Gynecology

## 2016-09-24 DIAGNOSIS — O365930 Maternal care for other known or suspected poor fetal growth, third trimester, not applicable or unspecified: Secondary | ICD-10-CM | POA: Diagnosis not present

## 2016-09-24 DIAGNOSIS — Z3A34 34 weeks gestation of pregnancy: Secondary | ICD-10-CM | POA: Insufficient documentation

## 2016-09-24 DIAGNOSIS — O30103 Triplet pregnancy, unspecified number of placenta and unspecified number of amniotic sacs, third trimester: Secondary | ICD-10-CM | POA: Insufficient documentation

## 2016-09-24 DIAGNOSIS — O365920 Maternal care for other known or suspected poor fetal growth, second trimester, not applicable or unspecified: Secondary | ICD-10-CM | POA: Diagnosis present

## 2016-09-30 ENCOUNTER — Ambulatory Visit (INDEPENDENT_AMBULATORY_CARE_PROVIDER_SITE_OTHER): Payer: BLUE CROSS/BLUE SHIELD | Admitting: Family Medicine

## 2016-09-30 VITALS — BP 106/74 | HR 114 | Wt 213.0 lb

## 2016-09-30 DIAGNOSIS — O0943 Supervision of pregnancy with grand multiparity, third trimester: Secondary | ICD-10-CM

## 2016-09-30 DIAGNOSIS — O0993 Supervision of high risk pregnancy, unspecified, third trimester: Secondary | ICD-10-CM

## 2016-09-30 DIAGNOSIS — O30103 Triplet pregnancy, unspecified number of placenta and unspecified number of amniotic sacs, third trimester: Secondary | ICD-10-CM | POA: Diagnosis not present

## 2016-09-30 DIAGNOSIS — O30109 Triplet pregnancy, unspecified number of placenta and unspecified number of amniotic sacs, unspecified trimester: Secondary | ICD-10-CM

## 2016-09-30 MED ORDER — BETAMETHASONE SOD PHOS & ACET 6 (3-3) MG/ML IJ SUSP
12.0000 mg | Freq: Once | INTRAMUSCULAR | Status: AC
Start: 2016-09-30 — End: 2016-09-30
  Administered 2016-09-30: 12 mg via INTRAMUSCULAR

## 2016-09-30 NOTE — Progress Notes (Addendum)
   PRENATAL VISIT NOTE  Subjective:  Sabrina Sanchez is a 26 y.o. G4P3003 at 2746w1d being seen today for ongoing prenatal care.  She is currently monitored for the following issues for this high-risk pregnancy and has Triplet gestation; Supervision of high-risk pregnancy; and Left inguinal hernia on her problem list.  Patient reports no complaints.   .  .   . Denies leaking of fluid.   The following portions of the patient's history were reviewed and updated as appropriate: allergies, current medications, past family history, past medical history, past social history, past surgical history and problem list. Problem list updated.  Objective:   Vitals:   09/30/16 1033  BP: 106/74  Pulse: (!) 114  Weight: 213 lb (96.6 kg)    Fetal Status: Fetal Heart Rate (bpm): 141/145/160         General:  Alert, oriented and cooperative. Patient is in no acute distress.  Skin: Skin is warm and dry. No rash noted.   Cardiovascular: Normal heart rate noted  Respiratory: Normal respiratory effort, no problems with respiration noted  Abdomen: Soft, gravid, appropriate for gestational age.       Pelvic:  Cervical exam deferred        Extremities: Normal range of motion.     Mental Status: Normal mood and affect. Normal behavior. Normal judgment and thought content.   Assessment and Plan:  Pregnancy: G4P3003 at 3446w1d  1. Supervision of high risk pregnancy in third trimester Has conceded that C-section is necessary due to non-vertex baby A. Would like to do this after 36 0/7 wks. C-section scheduled for Tuesday at 9:30--pre-delivery steroids given today and tomorrow with MFM. - betamethasone acetate-betamethasone sodium phosphate (CELESTONE) injection 12 mg; Inject 2 mLs (12 mg total) into the muscle once.  2. Triplet gestation U/s for growth tomorrow with BPP--plan for delivery next week.  Preterm labor symptoms and general obstetric precautions including but not limited to vaginal bleeding, contractions,  leaking of fluid and fetal movement were reviewed in detail with the patient. Please refer to After Visit Summary for other counseling recommendations.  No Follow-up on file.   Reva BoresPratt, Sabrina Sanchez S, MD

## 2016-09-30 NOTE — Patient Instructions (Signed)

## 2016-10-01 ENCOUNTER — Encounter (HOSPITAL_COMMUNITY): Payer: Self-pay

## 2016-10-01 ENCOUNTER — Ambulatory Visit (HOSPITAL_COMMUNITY)
Admission: RE | Admit: 2016-10-01 | Discharge: 2016-10-01 | Disposition: A | Discharge status: Home / Self Care | Payer: BLUE CROSS/BLUE SHIELD | Source: Ambulatory Visit | Attending: Obstetrics & Gynecology | Admitting: Obstetrics & Gynecology

## 2016-10-01 ENCOUNTER — Telehealth (HOSPITAL_COMMUNITY): Payer: Self-pay | Admitting: *Deleted

## 2016-10-01 ENCOUNTER — Ambulatory Visit (HOSPITAL_COMMUNITY)
Admission: RE | Admit: 2016-10-01 | Discharge: 2016-10-01 | Disposition: A | Discharge status: Home / Self Care | Payer: BLUE CROSS/BLUE SHIELD | Source: Ambulatory Visit | Attending: Family Medicine | Admitting: Family Medicine

## 2016-10-01 DIAGNOSIS — Z3A35 35 weeks gestation of pregnancy: Secondary | ICD-10-CM | POA: Insufficient documentation

## 2016-10-01 DIAGNOSIS — O365920 Maternal care for other known or suspected poor fetal growth, second trimester, not applicable or unspecified: Secondary | ICD-10-CM

## 2016-10-01 DIAGNOSIS — O30103 Triplet pregnancy, unspecified number of placenta and unspecified number of amniotic sacs, third trimester: Secondary | ICD-10-CM | POA: Insufficient documentation

## 2016-10-01 MED ORDER — BETAMETHASONE SOD PHOS & ACET 6 (3-3) MG/ML IJ SUSP
12.0000 mg | Freq: Once | INTRAMUSCULAR | Status: DC
  Filled 2016-10-01: qty 2

## 2016-10-01 MED ORDER — BETAMETHASONE SOD PHOS & ACET 6 (3-3) MG/ML IJ SUSP
12.0000 mg | Freq: Once | INTRAMUSCULAR | Status: AC
  Administered 2016-10-01: 12 mg via INTRAMUSCULAR
  Filled 2016-10-01: qty 2

## 2016-10-01 NOTE — Telephone Encounter (Signed)
Preadmission screen  

## 2016-10-03 ENCOUNTER — Telehealth (HOSPITAL_COMMUNITY): Payer: Self-pay | Admitting: *Deleted

## 2016-10-03 NOTE — Telephone Encounter (Signed)
Preadmission screen  

## 2016-10-06 ENCOUNTER — Encounter (HOSPITAL_COMMUNITY): Payer: Self-pay

## 2016-10-06 ENCOUNTER — Encounter (HOSPITAL_COMMUNITY)
Admission: RE | Admit: 2016-10-06 | Discharge: 2016-10-06 | Disposition: A | Discharge status: Home / Self Care | Payer: BLUE CROSS/BLUE SHIELD | Source: Ambulatory Visit | Attending: Obstetrics & Gynecology | Admitting: Obstetrics & Gynecology

## 2016-10-06 DIAGNOSIS — O321XX1 Maternal care for breech presentation, fetus 1: Secondary | ICD-10-CM | POA: Diagnosis present

## 2016-10-06 NOTE — H&P (Signed)
Sabrina Sanchez is an 26 y.o. [phone] 3872w0d female.   Chief Complaint: Breech baby A HPI: Here at 7936 + wks with TC/TA Triplet gestation and malpresentation of baby A for Primary C-section. She is s/p BMZ last week.  Past Medical History:  Diagnosis Date  . Fracture of wrist AGE 26 AND AGE 69  . Infection    UTI X 1  . Infection    OCC YEAST    Past Surgical History:  Procedure Laterality Date  . EYE SURGERY  AGE 47    Family History  Problem Relation Age of Onset  . Asthma Maternal Aunt   . Hyperlipidemia Maternal Grandmother   . Thrombophlebitis Maternal Grandmother   . Anesthesia problems Neg Hx    Social History:  reports that she has never smoked. She has never used smokeless tobacco. She reports that she does not drink alcohol or use drugs.   No Known Allergies  No current facility-administered medications on file prior to encounter.    Current Outpatient Prescriptions on File Prior to Encounter  Medication Sig Dispense Refill  . Prenatal Vit-Fe Fumarate-FA (PRENATAL MULTIVITAMIN) TABS tablet Take 1 tablet by mouth daily.       A comprehensive review of systems was negative.  Last menstrual period 02/04/2016, unknown if currently breastfeeding. General appearance: alert, cooperative and appears stated age Head: Normocephalic, without obvious abnormality, atraumatic Neck: supple, symmetrical, trachea midline Lungs: normal effort Heart: regular rate and rhythm Abdomen: largely gravid, non-tender Extremities: Homans sign is negative, no sign of DVT Skin: Skin color, texture, turgor normal. No rashes or lesions Neurologic: Grossly normal   Lab Results  Component Value Date   WBC 9.4 08/05/2016   HGB 10.5 (L) 05/05/2016   HGB 10.5 (L) 05/05/2016   HCT 31.7 (L) 08/05/2016   MCV 78 (L) 08/05/2016   PLT 236 08/05/2016         ABO, Rh: B/POS/-- (12/18 1620)  Antibody: NEG (12/18 1620)  Rubella: !Error!  RPR: Non Reactive (03/20 0906)  HBsAg: NEGATIVE (12/18  1620)  HIV: Non Reactive (03/20 0906)  GBS:       Assessment/Plan Active Problems:   Triplet gestation   Breech presentation with antenatal problem, fetus 1  For Primary C-section. Risks include but are not limited to bleeding, infection, injury to surrounding structures, including bowel, bladder and ureters, blood clots, and death.  Likelihood of success is high.   Sabrina Sanchez S Ladarion Munyon 10/06/2016, 4:03 PM

## 2016-10-07 ENCOUNTER — Inpatient Hospital Stay (HOSPITAL_COMMUNITY): Payer: BLUE CROSS/BLUE SHIELD | Admitting: Anesthesiology

## 2016-10-07 ENCOUNTER — Inpatient Hospital Stay (HOSPITAL_COMMUNITY)
Admission: RE | Admit: 2016-10-07 | Discharge: 2016-10-10 | DRG: 765 | Disposition: A | Discharge status: Home / Self Care | Payer: BLUE CROSS/BLUE SHIELD | Source: Ambulatory Visit | Attending: Family Medicine | Admitting: Family Medicine

## 2016-10-07 ENCOUNTER — Encounter (HOSPITAL_COMMUNITY): Payer: Self-pay | Admitting: *Deleted

## 2016-10-07 ENCOUNTER — Encounter (HOSPITAL_COMMUNITY)
Admission: RE | Disposition: A | Discharge status: Home / Self Care | Payer: Self-pay | Source: Ambulatory Visit | Attending: Family Medicine

## 2016-10-07 DIAGNOSIS — O321XX1 Maternal care for breech presentation, fetus 1: Principal | ICD-10-CM | POA: Diagnosis present

## 2016-10-07 DIAGNOSIS — O30103 Triplet pregnancy, unspecified number of placenta and unspecified number of amniotic sacs, third trimester: Secondary | ICD-10-CM | POA: Diagnosis present

## 2016-10-07 DIAGNOSIS — D62 Acute posthemorrhagic anemia: Secondary | ICD-10-CM | POA: Diagnosis not present

## 2016-10-07 DIAGNOSIS — O30109 Triplet pregnancy, unspecified number of placenta and unspecified number of amniotic sacs, unspecified trimester: Secondary | ICD-10-CM

## 2016-10-07 DIAGNOSIS — O3093 Multiple gestation, unspecified, third trimester: Secondary | ICD-10-CM | POA: Diagnosis present

## 2016-10-07 DIAGNOSIS — Z3A36 36 weeks gestation of pregnancy: Secondary | ICD-10-CM

## 2016-10-07 DIAGNOSIS — Z98891 History of uterine scar from previous surgery: Secondary | ICD-10-CM

## 2016-10-07 DIAGNOSIS — O9081 Anemia of the puerperium: Secondary | ICD-10-CM | POA: Diagnosis not present

## 2016-10-07 LAB — CBC
HCT: 32.9 % — ABNORMAL LOW (ref 36.0–46.0)
Hemoglobin: 10.1 g/dL — ABNORMAL LOW (ref 12.0–15.0)
MCH: 23.1 pg — ABNORMAL LOW (ref 26.0–34.0)
MCHC: 30.7 g/dL (ref 30.0–36.0)
MCV: 75.1 fL — ABNORMAL LOW (ref 78.0–100.0)
Platelets: 217 10*3/uL (ref 150–400)
RBC: 4.38 MIL/uL (ref 3.87–5.11)
RDW: 18.8 % — AB (ref 11.5–15.5)
WBC: 7.9 10*3/uL (ref 4.0–10.5)

## 2016-10-07 LAB — RPR: RPR Ser Ql: NONREACTIVE

## 2016-10-07 SURGERY — Surgical Case
Anesthesia: Epidural | Site: Abdomen | Wound class: Clean Contaminated

## 2016-10-07 MED ORDER — TETANUS-DIPHTH-ACELL PERTUSSIS 5-2.5-18.5 LF-MCG/0.5 IM SUSP: 0.5000 mL | Freq: Once | INTRAMUSCULAR | Status: DC

## 2016-10-07 MED ORDER — LACTATED RINGERS IV SOLN
INTRAVENOUS | Status: DC
  Administered 2016-10-07 (×3): via INTRAVENOUS

## 2016-10-07 MED ORDER — SIMETHICONE 80 MG PO CHEW: 80.0000 mg | CHEWABLE_TABLET | ORAL | Status: DC | PRN

## 2016-10-07 MED ORDER — NALBUPHINE HCL 10 MG/ML IJ SOLN
5.0000 mg | Freq: Once | INTRAMUSCULAR | Status: AC | PRN
  Administered 2016-10-07: 5 mg via SUBCUTANEOUS

## 2016-10-07 MED ORDER — OXYCODONE HCL 5 MG PO TABS
10.0000 mg | ORAL_TABLET | ORAL | Status: DC | PRN
  Administered 2016-10-07 – 2016-10-10 (×11): 10 mg via ORAL
  Filled 2016-10-07 (×11): qty 2

## 2016-10-07 MED ORDER — NALBUPHINE HCL 10 MG/ML IJ SOLN: 5.0000 mg | Freq: Once | INTRAMUSCULAR | Status: AC | PRN

## 2016-10-07 MED ORDER — NALBUPHINE HCL 10 MG/ML IJ SOLN
INTRAMUSCULAR | Status: AC
  Filled 2016-10-07: qty 1

## 2016-10-07 MED ORDER — IBUPROFEN 600 MG PO TABS
600.0000 mg | ORAL_TABLET | Freq: Four times a day (QID) | ORAL | Status: DC
  Administered 2016-10-07 – 2016-10-10 (×11): 600 mg via ORAL
  Filled 2016-10-07 (×11): qty 1

## 2016-10-07 MED ORDER — SCOPOLAMINE 1 MG/3DAYS TD PT72: 1.0000 | MEDICATED_PATCH | Freq: Once | TRANSDERMAL | Status: DC

## 2016-10-07 MED ORDER — KETOROLAC TROMETHAMINE 30 MG/ML IJ SOLN
30.0000 mg | Freq: Once | INTRAMUSCULAR | Status: AC
  Administered 2016-10-07: 30 mg via INTRAVENOUS
  Filled 2016-10-07: qty 1

## 2016-10-07 MED ORDER — BUPIVACAINE HCL (PF) 0.25 % IJ SOLN
INTRAMUSCULAR | Status: DC | PRN
Start: 2016-10-07 — End: 2016-10-07
  Administered 2016-10-07: 30 mL

## 2016-10-07 MED ORDER — SIMETHICONE 80 MG PO CHEW
80.0000 mg | CHEWABLE_TABLET | Freq: Three times a day (TID) | ORAL | Status: DC
  Administered 2016-10-08 – 2016-10-10 (×6): 80 mg via ORAL
  Filled 2016-10-07 (×7): qty 1

## 2016-10-07 MED ORDER — OXYTOCIN 10 UNIT/ML IJ SOLN
INTRAMUSCULAR | Status: AC
  Filled 2016-10-07: qty 4

## 2016-10-07 MED ORDER — OXYCODONE HCL 5 MG PO TABS
5.0000 mg | ORAL_TABLET | ORAL | Status: DC | PRN
Start: 2016-10-07 — End: 2016-10-10

## 2016-10-07 MED ORDER — LACTATED RINGERS IV SOLN
INTRAVENOUS | Status: DC
  Administered 2016-10-07 – 2016-10-08 (×2): via INTRAVENOUS

## 2016-10-07 MED ORDER — PRENATAL MULTIVITAMIN CH
1.0000 | ORAL_TABLET | Freq: Every day | ORAL | Status: DC
  Administered 2016-10-08 – 2016-10-10 (×3): 1 via ORAL
  Filled 2016-10-07 (×3): qty 1

## 2016-10-07 MED ORDER — PHENYLEPHRINE 8 MG IN D5W 100 ML (0.08MG/ML) PREMIX OPTIME
INJECTION | INTRAVENOUS | Status: AC
  Filled 2016-10-07: qty 100

## 2016-10-07 MED ORDER — MORPHINE SULFATE (PF) 0.5 MG/ML IJ SOLN
INTRAMUSCULAR | Status: DC | PRN
  Administered 2016-10-07: .1 mg via INTRATHECAL

## 2016-10-07 MED ORDER — HYDROMORPHONE HCL 1 MG/ML IJ SOLN
INTRAMUSCULAR | Status: AC
  Filled 2016-10-07: qty 1

## 2016-10-07 MED ORDER — ACETAMINOPHEN 160 MG/5ML PO SOLN
975.0000 mg | Freq: Once | ORAL | Status: AC
  Administered 2016-10-07: 975 mg via ORAL
  Filled 2016-10-07: qty 40.6

## 2016-10-07 MED ORDER — COCONUT OIL OIL
1.0000 "application " | TOPICAL_OIL | Status: DC | PRN
  Filled 2016-10-07: qty 120

## 2016-10-07 MED ORDER — BUPIVACAINE HCL (PF) 0.25 % IJ SOLN
INTRAMUSCULAR | Status: AC
  Filled 2016-10-07: qty 20

## 2016-10-07 MED ORDER — LACTATED RINGERS IV SOLN
INTRAVENOUS | Status: DC | PRN
  Administered 2016-10-07: 10:00:00 via INTRAVENOUS

## 2016-10-07 MED ORDER — BUPIVACAINE IN DEXTROSE 0.75-8.25 % IT SOLN
INTRATHECAL | Status: AC
  Filled 2016-10-07: qty 2

## 2016-10-07 MED ORDER — SODIUM CHLORIDE 0.9% FLUSH: 3.0000 mL | INTRAVENOUS | Status: DC | PRN

## 2016-10-07 MED ORDER — ERYTHROMYCIN 5 MG/GM OP OINT
TOPICAL_OINTMENT | OPHTHALMIC | Status: AC
  Filled 2016-10-07: qty 1

## 2016-10-07 MED ORDER — ZOLPIDEM TARTRATE 5 MG PO TABS: 5.0000 mg | ORAL_TABLET | Freq: Every evening | ORAL | Status: DC | PRN

## 2016-10-07 MED ORDER — ONDANSETRON HCL 4 MG/2ML IJ SOLN
INTRAMUSCULAR | Status: AC
  Filled 2016-10-07: qty 2

## 2016-10-07 MED ORDER — NALBUPHINE HCL 10 MG/ML IJ SOLN: 5.0000 mg | INTRAMUSCULAR | Status: DC | PRN

## 2016-10-07 MED ORDER — OXYTOCIN 10 UNIT/ML IJ SOLN
INTRAVENOUS | Status: DC | PRN
  Administered 2016-10-07: 40 [IU] via INTRAVENOUS

## 2016-10-07 MED ORDER — BUPIVACAINE HCL (PF) 0.25 % IJ SOLN
INTRAMUSCULAR | Status: AC
  Filled 2016-10-07: qty 10

## 2016-10-07 MED ORDER — ONDANSETRON HCL 4 MG/2ML IJ SOLN: 4.0000 mg | Freq: Three times a day (TID) | INTRAMUSCULAR | Status: DC | PRN

## 2016-10-07 MED ORDER — HYDROMORPHONE HCL 1 MG/ML IJ SOLN
0.2500 mg | INTRAMUSCULAR | Status: DC | PRN
  Administered 2016-10-07 (×2): 0.25 mg via INTRAVENOUS

## 2016-10-07 MED ORDER — FENTANYL CITRATE (PF) 100 MCG/2ML IJ SOLN
INTRAMUSCULAR | Status: AC
  Filled 2016-10-07: qty 2

## 2016-10-07 MED ORDER — CEFAZOLIN SODIUM-DEXTROSE 2-4 GM/100ML-% IV SOLN
2.0000 g | INTRAVENOUS | Status: AC
  Administered 2016-10-07: 2 g via INTRAVENOUS
  Filled 2016-10-07: qty 100

## 2016-10-07 MED ORDER — WITCH HAZEL-GLYCERIN EX PADS: 1.0000 "application " | MEDICATED_PAD | CUTANEOUS | Status: DC | PRN

## 2016-10-07 MED ORDER — PHENYLEPHRINE 8 MG IN D5W 100 ML (0.08MG/ML) PREMIX OPTIME
INJECTION | INTRAVENOUS | Status: DC | PRN
  Administered 2016-10-07: 60 ug/min via INTRAVENOUS

## 2016-10-07 MED ORDER — DIPHENHYDRAMINE HCL 25 MG PO CAPS: 25.0000 mg | ORAL_CAPSULE | Freq: Four times a day (QID) | ORAL | Status: DC | PRN

## 2016-10-07 MED ORDER — ACETAMINOPHEN 500 MG PO TABS
1000.0000 mg | ORAL_TABLET | Freq: Four times a day (QID) | ORAL | Status: AC
  Administered 2016-10-07 – 2016-10-08 (×3): 1000 mg via ORAL
  Filled 2016-10-07 (×3): qty 2

## 2016-10-07 MED ORDER — DIPHENHYDRAMINE HCL 25 MG PO CAPS
25.0000 mg | ORAL_CAPSULE | ORAL | Status: DC | PRN
  Filled 2016-10-07: qty 1

## 2016-10-07 MED ORDER — NALOXONE HCL 2 MG/2ML IJ SOSY
1.0000 ug/kg/h | PREFILLED_SYRINGE | INTRAVENOUS | Status: DC | PRN
  Filled 2016-10-07: qty 2

## 2016-10-07 MED ORDER — DIBUCAINE 1 % RE OINT: 1.0000 "application " | TOPICAL_OINTMENT | RECTAL | Status: DC | PRN

## 2016-10-07 MED ORDER — PHENYLEPHRINE HCL 10 MG/ML IJ SOLN
INTRAMUSCULAR | Status: DC | PRN
  Administered 2016-10-07 (×2): 40 ug via INTRAVENOUS

## 2016-10-07 MED ORDER — SIMETHICONE 80 MG PO CHEW
80.0000 mg | CHEWABLE_TABLET | ORAL | Status: DC
  Administered 2016-10-07 – 2016-10-10 (×3): 80 mg via ORAL
  Filled 2016-10-07 (×3): qty 1

## 2016-10-07 MED ORDER — OXYTOCIN 40 UNITS IN LACTATED RINGERS INFUSION - SIMPLE MED: 2.5000 [IU]/h | INTRAVENOUS | Status: AC

## 2016-10-07 MED ORDER — PHENYLEPHRINE 40 MCG/ML (10ML) SYRINGE FOR IV PUSH (FOR BLOOD PRESSURE SUPPORT)
PREFILLED_SYRINGE | INTRAVENOUS | Status: AC
  Filled 2016-10-07: qty 10

## 2016-10-07 MED ORDER — OXYTOCIN 40 UNITS IN LACTATED RINGERS INFUSION - SIMPLE MED
INTRAVENOUS | Status: AC
  Filled 2016-10-07: qty 1000

## 2016-10-07 MED ORDER — DIPHENHYDRAMINE HCL 50 MG/ML IJ SOLN: 12.5000 mg | INTRAMUSCULAR | Status: DC | PRN

## 2016-10-07 MED ORDER — SCOPOLAMINE 1 MG/3DAYS TD PT72
MEDICATED_PATCH | TRANSDERMAL | Status: DC | PRN
  Administered 2016-10-07: 1 via TRANSDERMAL

## 2016-10-07 MED ORDER — VITAMIN K1 1 MG/0.5ML IJ SOLN
INTRAMUSCULAR | Status: DC
Start: 2016-10-07 — End: 2016-10-07
  Filled 2016-10-07: qty 0.5

## 2016-10-07 MED ORDER — BUPIVACAINE-EPINEPHRINE (PF) 0.25% -1:200000 IJ SOLN
INTRAMUSCULAR | Status: AC
  Filled 2016-10-07: qty 30

## 2016-10-07 MED ORDER — NALOXONE HCL 0.4 MG/ML IJ SOLN: 0.4000 mg | INTRAMUSCULAR | Status: DC | PRN

## 2016-10-07 MED ORDER — ACETAMINOPHEN 325 MG PO TABS
650.0000 mg | ORAL_TABLET | ORAL | Status: DC | PRN
  Administered 2016-10-08 – 2016-10-10 (×3): 650 mg via ORAL
  Filled 2016-10-07 (×3): qty 2

## 2016-10-07 MED ORDER — MENTHOL 3 MG MT LOZG: 1.0000 | LOZENGE | OROMUCOSAL | Status: DC | PRN

## 2016-10-07 MED ORDER — ONDANSETRON HCL 4 MG/2ML IJ SOLN
INTRAMUSCULAR | Status: DC | PRN
  Administered 2016-10-07: 4 mg via INTRAVENOUS

## 2016-10-07 MED ORDER — MEPERIDINE HCL 25 MG/ML IJ SOLN: 6.2500 mg | INTRAMUSCULAR | Status: DC | PRN

## 2016-10-07 MED ORDER — FENTANYL CITRATE (PF) 100 MCG/2ML IJ SOLN
INTRAMUSCULAR | Status: DC | PRN
  Administered 2016-10-07: 25 ug via INTRATHECAL

## 2016-10-07 MED ORDER — MORPHINE SULFATE (PF) 0.5 MG/ML IJ SOLN
INTRAMUSCULAR | Status: AC
  Filled 2016-10-07: qty 10

## 2016-10-07 MED ORDER — SENNOSIDES-DOCUSATE SODIUM 8.6-50 MG PO TABS
2.0000 | ORAL_TABLET | ORAL | Status: DC
  Administered 2016-10-07 – 2016-10-10 (×3): 2 via ORAL
  Filled 2016-10-07 (×3): qty 2

## 2016-10-07 SURGICAL SUPPLY — 38 items
BENZOIN TINCTURE PRP APPL 2/3 (GAUZE/BANDAGES/DRESSINGS) ×4 IMPLANT
CHLORAPREP W/TINT 26ML (MISCELLANEOUS) ×8 IMPLANT
CLAMP CORD UMBIL (MISCELLANEOUS) IMPLANT
CLOSURE WOUND 1/2 X4 (GAUZE/BANDAGES/DRESSINGS) ×2
CLOTH BEACON ORANGE TIMEOUT ST (SAFETY) ×4 IMPLANT
DECANTER SPIKE VIAL GLASS SM (MISCELLANEOUS) ×4 IMPLANT
DEVICE BLD TRNS LUER ATTCH (MISCELLANEOUS) ×12 IMPLANT
DRSG OPSITE POSTOP 4X10 (GAUZE/BANDAGES/DRESSINGS) ×4 IMPLANT
ELECT REM PT RETURN 9FT ADLT (ELECTROSURGICAL) ×4
ELECTRODE REM PT RTRN 9FT ADLT (ELECTROSURGICAL) ×2 IMPLANT
EXTRACTOR VACUUM M CUP 4 TUBE (SUCTIONS) IMPLANT
EXTRACTOR VACUUM M CUP 4' TUBE (SUCTIONS)
GAUZE SPONGE 4X4 12PLY STRL LF (GAUZE/BANDAGES/DRESSINGS) ×8 IMPLANT
GLOVE BIOGEL PI IND STRL 7.0 (GLOVE) ×8 IMPLANT
GLOVE BIOGEL PI INDICATOR 7.0 (GLOVE) ×8
GLOVE ECLIPSE 7.0 STRL STRAW (GLOVE) ×8 IMPLANT
GLOVE ECLIPSE 7.5 STRL STRAW (GLOVE) ×4 IMPLANT
GOWN STRL REUS W/TWL LRG LVL3 (GOWN DISPOSABLE) ×8 IMPLANT
HEMOSTAT ARISTA ABSORB 3G PWDR (MISCELLANEOUS) ×4 IMPLANT
KIT ABG SYR 3ML LUER SLIP (SYRINGE) IMPLANT
NEEDLE HYPO 22GX1.5 SAFETY (NEEDLE) ×4 IMPLANT
NEEDLE HYPO 25X5/8 SAFETYGLIDE (NEEDLE) IMPLANT
NS IRRIG 1000ML POUR BTL (IV SOLUTION) ×4 IMPLANT
PACK C SECTION WH (CUSTOM PROCEDURE TRAY) ×4 IMPLANT
PAD ABD 7.5X8 STRL (GAUZE/BANDAGES/DRESSINGS) ×4 IMPLANT
PAD OB MATERNITY 4.3X12.25 (PERSONAL CARE ITEMS) ×4 IMPLANT
PENCIL SMOKE EVAC W/HOLSTER (ELECTROSURGICAL) ×4 IMPLANT
RTRCTR C-SECT PINK 25CM LRG (MISCELLANEOUS) ×4 IMPLANT
SPONGE GAUZE 4X4 12PLY STER LF (GAUZE/BANDAGES/DRESSINGS) ×4 IMPLANT
STRIP CLOSURE SKIN 1/2X4 (GAUZE/BANDAGES/DRESSINGS) ×6 IMPLANT
SUT PLAIN 2 0 XLH (SUTURE) ×4 IMPLANT
SUT VIC AB 0 CTX 36 (SUTURE) ×8
SUT VIC AB 0 CTX36XBRD ANBCTRL (SUTURE) ×8 IMPLANT
SUT VIC AB 4-0 KS 27 (SUTURE) ×4 IMPLANT
SYR 30ML LL (SYRINGE) ×4 IMPLANT
SYR BULB 3OZ (MISCELLANEOUS) ×12 IMPLANT
TOWEL OR 17X24 6PK STRL BLUE (TOWEL DISPOSABLE) ×4 IMPLANT
TRAY FOLEY BAG SILVER LF 14FR (SET/KITS/TRAYS/PACK) ×4 IMPLANT

## 2016-10-07 NOTE — Op Note (Signed)
Cesarean Section Operative Report  Sabrina Sanchez Gonsalez  10/07/2016  Indications: triplet gestation, baby A breech presentation  Pre-operative Diagnosis: multiple gestation.   Post-operative Diagnosis: Same   Surgeon: Surgeon(s) and Role:    * Shawnie PonsPratt, Shelbie Proctoranya S, MD - Primary    * Imari Sivertsen, Wilfred CurtisNoah Bedford, MD - Assisting   Anesthesia: spinal    Estimated Blood Loss: 1100 ml  Total IV Fluids: 2500 ml LR  Urine Output:: 50 ml clear yellow urine  Specimens: none  Findings:  Baby A: Viable female infant in breech presentation; Apgars pending; weight pending g; arterial cord pH not obtained; clear amniotic fluid; intact placenta with three vessel cord Baby B: Viable female infant in cephalicpresentation; Apgars pending; weight pending g; arterial cord pH not obtained; clear amniotic fluid; intact placenta with three vessel cord Baby C: Viable female infant in cephalic presentation; Apgars pending; weight pending g; arterial cord pH not obtained; clear amniotic fluid; intact placenta with three vessel cord Normal uterus, fallopian tubes and ovaries bilaterally.  Baby condition / location:  Couplet care / Skin to Skin   Complications: no complications  Indications: Sabrina Sanchez Gilbertson is a 26 y.o. [id number] with an IUP 6179w1d presenting for primary cesarean section for triplet gestation with baby A vertex.  The risks, benefits, complications, treatment options, and exected outcomes were discussed with the patient . The patient dwith the proposed plan, giving informed consent. identified as Sabrina Sanchez Wanless and the procedure verified as C-Section Delivery.  Procedure Details:  The patient was taken back to the operative suite where spinal anesthesia was placed.  A time out was held and the above information confirmed.   After induction of anesthesia, the patient was draped and prepped in the usual sterile manner and placed in a dorsal supine position with a leftward tilt. A Pfannenstiel incision was made and carried  down through the subcutaneous tissue to the fascia. Fascial incision was made and bluntly extended transversely. The fascia was separated from the underlying rectus tissue superiorly and inferiorly. The peritoneum was identified and bluntly entered and extended longitudinally. Alexis retractor was placed. A low transverse uterine incision was made and extended bluntly. Delivered from breech presentation was a viable infant with Apgars and weight as above.  After waiting 60 seconds for delayed cord cutting, the umbilical cord was clamped and cut cord blood was obtained for evaluation. Cord ph was not sent. The above procedure was followed for delivery of babies b and c, both of whom were delivered from the cephalic presentation. The placentas were removed Intact and appeared normal. The uterine outline, tubes and ovaries appeared normal. The uterine incision was closed with running locked sutures of 0Vicryl with an imbricating layer of the same.   Hemostasis was observed after placement of two figure-of-eight sutures 0 vicryl at the right margin of the hysterotomy; Arista was also placed in that area. The peritoneum was closed with 0 vicryl. The rectus muscles were examined and hemostasis observed. The fascia was then reapproximated with running sutures of 0Vicryl. A total of 30 ml 0.25 % Marcaine was injected subcutaneously at the margins of the incision. The skin was closed with 4-0Vicryl.   Instrument, sponge, and needle counts were correct prior the abdominal closure and were correct at the conclusion of the case.     Disposition: PACU - hemodynamically stable.   Maternal Condition: stable       Signed: Lavonne Chickoah B WoukMD 10/07/2016 10:46 AM

## 2016-10-07 NOTE — Progress Notes (Signed)
RN attempted to stand patient up for ortho VS. Patient unable to stand due to shortness of breath per patient. Patient will take a deep breath when encouraged by RN. Patient returned to a sitting position and symptoms resolved. IS used and patient able to get to 2500.  Vitals WDL, and no other signs of distress. Resident notified and new orders placed for CBC now. Will continue to monitor. Royston CowperIsley, Rogers Ditter E, RN

## 2016-10-07 NOTE — Lactation Note (Signed)
This note was copied from a baby's chart. Lactation Consultation Note  Patient Name: Sabrina Sanchez ZOXWR'UToday's Date: 10/07/2016 Reason for consult: Initial assessment;Multiple gestation (triplets, 36 1/[redacted] weeks gestation)  With baby A of tri[lets, now 2 hours old. This baby is the smallest, at 4 lbs 15.9 oz. She fed well, but fell asleep after 6 minutes. Some basic teaching done with mom, but she is an experienced breast feeder. She was  very comfortable breastfeeding 2 babies at a time, each in football hold, with assistance. Mom was told that the LPI feeding protocol will be explained to her, once she gets to her room. Mom and dad very receptive to teaching, and very excited at this time.    Maternal Data Formula Feeding for Exclusion: No (since babies a LPI, they will need supplemetn with formula) Has patient been taught Hand Expression?: Yes Does the patient have breastfeeding experience prior to this delivery?: Yes  Feeding Feeding Type: Breast Fed Length of feed: 6 min  LATCH Score/Interventions Latch: Repeated attempts needed to sustain latch, nipple held in mouth throughout feeding, stimulation needed to elicit sucking reflex.  Audible Swallowing: A few with stimulation  Type of Nipple: Everted at rest and after stimulation  Comfort (Breast/Nipple): Soft / non-tender     Hold (Positioning): Assistance needed to correctly position infant at breast and maintain latch. Intervention(s): Skin to skin;Breastfeeding basics reviewed  LATCH Score: 7  Lactation Tools Discussed/Used     Consult Status Consult Status: Follow-up Date: 10/08/16 Follow-up type: In-patient    Alfred LevinsLee, Amilliana Hayworth Anne 10/07/2016, 12:15 PM

## 2016-10-07 NOTE — Addendum Note (Signed)
Addendum  created 10/07/16 1549 by Elgie CongoMalinova, Makari Sanko H, CRNA   Sign clinical note

## 2016-10-07 NOTE — Transfer of Care (Signed)
Immediate Anesthesia Transfer of Care Note  Patient: Sabrina Sanchez  Procedure(s) Performed: Procedure(s): CESAREAN SECTION MULTI-GESTATIONAL (N/A)  Patient Location: PACU  Anesthesia Type:Spinal  Level of Consciousness: awake, alert  and oriented  Airway & Oxygen Therapy: Patient Spontanous Breathing  Post-op Assessment: Report given to RN and Post -op Vital signs reviewed and stable  Post vital signs: Reviewed and stable  Last Vitals:  Vitals:   10/07/16 0807 10/07/16 1059  BP: 116/78 103/67  Pulse: 100 87  Resp: 18 16  Temp: 37.1 C 36.6 C    Last Pain:  Vitals:   10/07/16 0807  TempSrc: Oral  PainSc: 0-No pain      Patients Stated Pain Goal: 4 (10/07/16 0807)  Complications: No apparent anesthesia complications

## 2016-10-07 NOTE — Anesthesia Postprocedure Evaluation (Signed)
Anesthesia Post Note  Patient: Sabrina Sanchez  Procedure(s) Performed: Procedure(s) (LRB): CESAREAN SECTION MULTI-GESTATIONAL (N/A)  Patient location during evaluation: Mother Baby Anesthesia Type: Spinal Level of consciousness: awake and alert and oriented Pain management: pain level controlled Vital Signs Assessment: post-procedure vital signs reviewed and stable Respiratory status: spontaneous breathing and nonlabored ventilation Cardiovascular status: stable Postop Assessment: no headache, patient able to bend at knees, no backache, no signs of nausea or vomiting, spinal receding and adequate PO intake Anesthetic complications: no        Last Vitals:  Vitals:   10/07/16 1330 10/07/16 1430  BP: 109/76 115/68  Pulse: 81 70  Resp: 18 18  Temp:      Last Pain:  Vitals:   10/07/16 1430  TempSrc:   PainSc: 8    Pain Goal: Patients Stated Pain Goal: 4 (10/07/16 0807)               Laban EmperorMalinova,Zamorah Ailes Hristova

## 2016-10-07 NOTE — Lactation Note (Signed)
This note was copied from a baby's chart. Lactation Consultation Note  Patient Name: Sabrina Sanchez ZOXWR'UToday's Date: 10/07/2016 Reason for consult: Initial assessment;Multiple gestation;Infant < 6lbs (36 1/7 week triplets). Wwth Baby B of triplets, at 1 hours of age. She weighs 5 lbs 8.7 oz.  Baby B latched easily, with strong suckles and intermittent visible swallows. She fed well for 26 minutes. Mom experienced breast feeder, and very comfortable with latching and feeding these babies. Mom was told that the LPI feeding policy will be reviewed with her , once she gets to her room. Parents very calm yet excited.    Maternal Data Formula Feeding for Exclusion:  (babut babies witll need to be supplemetned with formula as LPI) Has patient been taught Hand Expression?: Yes Does the patient have breastfeeding experience prior to this delivery?: Yes  Feeding Feeding Type: Breast Fed Length of feed: 26 min  LATCH Score/Interventions Latch: Grasps breast easily, tongue down, lips flanged, rhythmical sucking.  Audible Swallowing: A few with stimulation  Type of Nipple: Everted at rest and after stimulation  Comfort (Breast/Nipple): Soft / non-tender     Hold (Positioning): Assistance needed to correctly position infant at breast and maintain latch. Intervention(s): Breastfeeding basics reviewed;Skin to skin  LATCH Score: 8  Lactation Tools Discussed/Used     Consult Status Consult Status: Follow-up Date: 10/08/16 Follow-up type: In-patient    Alfred LevinsLee, Traycen Goyer Anne 10/07/2016, 12:21 PM

## 2016-10-07 NOTE — Lactation Note (Signed)
This note was copied from a baby's chart. Lactation Consultation Note  Patient Name: Sabrina Sanchez Sabrina Sanchez WUJWJ'XToday's Date: 10/07/2016 Reason for consult: Follow-up assessment;Multiple gestation;Late preterm infant;Infant < 6lbswith this mom of LPI triplets, now 3 hours old. I reviewed the LPI feeding policy with parents, set up DEP, and since all 3 babies breast fed at 113-12. I told mom to begin pumping after they breast feed, bottle feed at 1600. Mom in a lot of pain now. Pump use will need to be done later. Each baby bottle fed well, alimentum. ( see their flow sheets).Lactation services also briefly reviewed with mom.  Mom knows to call lactation for questions/conerns.    Maternal Data Formula Feeding for Exclusion:  (babut babies witll need to be supplemetned with formula as LPI) Has patient been taught Hand Expression?: Yes Does the patient have breastfeeding experience prior to this delivery?: Yes  Feeding Feeding Type: Bottle Fed - Formula Nipple Type: Slow - flow Length of feed: 10 min  LATCH Score/Interventions Latch: Grasps breast easily, tongue down, lips flanged, rhythmical sucking.  Audible Swallowing: A few with stimulation  Type of Nipple: Everted at rest and after stimulation  Comfort (Breast/Nipple): Soft / non-tender     Hold (Positioning): Assistance needed to correctly position infant at breast and maintain latch. Intervention(s): Breastfeeding basics reviewed;Skin to skin  LATCH Score: 8  Lactation Tools Discussed/Used     Consult Status Consult Status: Follow-up Date: 10/08/16 Follow-up type: In-patient    Alfred LevinsLee, Aidah Forquer Anne 10/07/2016, 1:47 PM

## 2016-10-07 NOTE — Anesthesia Postprocedure Evaluation (Signed)
Anesthesia Post Note  Patient: Sabrina Sanchez  Procedure(s) Performed: Procedure(s) (LRB): CESAREAN SECTION MULTI-GESTATIONAL (N/A)  Patient location during evaluation: PACU Anesthesia Type: Spinal Level of consciousness: awake Pain management: satisfactory to patient Vital Signs Assessment: post-procedure vital signs reviewed and stable Respiratory status: spontaneous breathing Cardiovascular status: blood pressure returned to baseline Postop Assessment: no headache and spinal receding Anesthetic complications: no        Last Vitals:  Vitals:   10/07/16 1330 10/07/16 1430  BP: 109/76 115/68  Pulse: 81 70  Resp: 18 18  Temp:      Last Pain:  Vitals:   10/07/16 1430  TempSrc:   PainSc: 8    Pain Goal: Patients Stated Pain Goal: 4 (10/07/16 0807)               Cristela BlueJACKSON,Yadira Hada EDWARD

## 2016-10-07 NOTE — Interval H&P Note (Signed)
History and Physical Interval Note:  10/07/2016 8:58 AM  Sabrina Sanchez  has presented today for surgery, with the diagnosis of TRIPLETS - OB Fellow to assist  The various methods of treatment have been discussed with the patient and family. After consideration of risks, benefits and other options for treatment, the patient has consented to  Procedure(s) with comments: CESAREAN SECTION (N/A) - TRIPLETS as a surgical intervention .  The patient's history has been reviewed, patient examined, no change in status, stable for surgery.  I have reviewed the patient's chart and labs.  Questions were answered to the patient's satisfaction.     Reva Boresanya S Pratt

## 2016-10-07 NOTE — Lactation Note (Signed)
This note was copied from a baby's chart. Lactation Consultation Note  Patient Name: Sabrina Sanchez Sabrina Sanchez [id number]'s Date: 10/07/2016 Reason for consult: Initial assessment;Infant < 6lbs;Multiple gestation;Late preterm infant (Triiples. 36 1/7 weeks)   With this mom of triplets, now less than 2 hours old. Baby C weighs 5 lbs 13.3 oz. He ws latched when I got to PACU. He  had strong, rhythmic , suckles with intermittent swallows  for  30 minutes, and unlatched on his own. Mom is an experienced breast feeder, and very comfortable latching these babies. The only reason she needed help wos that she was a fresh post op and was feeding 2 babies at a time. Mom aware the [facility] feeding policy will be reviewed with her, once she gets to her room. Mom would prefer breast milk, but is fine with using Alimentum to supplement the babies.    Maternal Data Formula Feeding for Exclusion: No Has patient been taught Hand Expression?: Yes Does the patient have breastfeeding experience prior to this delivery?: Yes  Feeding Feeding Type: Breast Fed Length of feed: 20 min  LATCH Score/Interventions Latch: Grasps breast easily, tongue down, lips flanged, rhythmical sucking.  Audible Swallowing: A few with stimulation Intervention(s): Skin to skin;Hand expression  Type of Nipple: Everted at rest and after stimulation  Comfort (Breast/Nipple): Soft / non-tender     Hold (Positioning): No assistance needed to correctly position infant at breast.  LATCH Score: 9  Lactation Tools Discussed/Used     Consult Status Consult Status: Follow-up Date: 10/08/16 Follow-up type: In-patient    Alfred LevinsLee, Shiree Altemus Anne 10/07/2016, 12:28 PM

## 2016-10-07 NOTE — Anesthesia Preprocedure Evaluation (Signed)
Anesthesia Evaluation  Patient identified by MRN, date of birth, ID band Patient awake    Reviewed: Allergy & Precautions, H&P , Patient's Chart, lab work & pertinent test results, reviewed documented beta blocker date and time   Airway Mallampati: II  TM Distance: >3 FB Neck ROM: full    Dental no notable dental hx.    Pulmonary    Pulmonary exam normal breath sounds clear to auscultation       Cardiovascular  Rhythm:regular Rate:Normal     Neuro/Psych    GI/Hepatic   Endo/Other    Renal/GU      Musculoskeletal   Abdominal   Peds  Hematology   Anesthesia Other Findings   Reproductive/Obstetrics                             Anesthesia Physical Anesthesia Plan  ASA: II  Anesthesia Plan: Spinal, Combined Spinal and Epidural and Epidural   Post-op Pain Management:    Induction:   Airway Management Planned:   Additional Equipment:   Intra-op Plan:   Post-operative Plan:   Informed Consent: I have reviewed the patients History and Physical, chart, labs and discussed the procedure including the risks, benefits and alternatives for the proposed anesthesia with the patient or authorized representative who has indicated his/her understanding and acceptance.   Dental Advisory Given  Plan Discussed with: CRNA and Surgeon  Anesthesia Plan Comments: (  )        Anesthesia Quick Evaluation

## 2016-10-08 LAB — CBC
HCT: 21.1 % — ABNORMAL LOW (ref 36.0–46.0)
HEMATOCRIT: 22.5 % — AB (ref 36.0–46.0)
Hemoglobin: 6.6 g/dL — CL (ref 12.0–15.0)
Hemoglobin: 7 g/dL — ABNORMAL LOW (ref 12.0–15.0)
MCH: 23.4 pg — ABNORMAL LOW (ref 26.0–34.0)
MCH: 23.6 pg — AB (ref 26.0–34.0)
MCHC: 31.1 g/dL (ref 30.0–36.0)
MCHC: 31.3 g/dL (ref 30.0–36.0)
MCV: 75.3 fL — AB (ref 78.0–100.0)
MCV: 75.4 fL — AB (ref 78.0–100.0)
PLATELETS: 139 10*3/uL — AB (ref 150–400)
Platelets: 151 10*3/uL (ref 150–400)
RBC: 2.8 MIL/uL — AB (ref 3.87–5.11)
RBC: 2.99 MIL/uL — ABNORMAL LOW (ref 3.87–5.11)
RDW: 18.6 % — AB (ref 11.5–15.5)
RDW: 18.8 % — ABNORMAL HIGH (ref 11.5–15.5)
WBC: 8.3 10*3/uL (ref 4.0–10.5)
WBC: 9.3 10*3/uL (ref 4.0–10.5)

## 2016-10-08 LAB — PREPARE RBC (CROSSMATCH)

## 2016-10-08 MED ORDER — SODIUM CHLORIDE 0.9 % IV SOLN
Freq: Once | INTRAVENOUS | Status: AC
  Administered 2016-10-08: 10 mL/h via INTRAVENOUS

## 2016-10-08 NOTE — Progress Notes (Signed)
Howard PouchLauren Feng, M.D. Informed that the second unit of RBCs infused as of 1800. I informed her also of patient's orthostatic vital signs and patient's complaint of upper chest tightness and mild dizziness upon standing. BP standing: 121/67, Pulse standing: 106. Plan repeat CBC now.

## 2016-10-08 NOTE — Progress Notes (Addendum)
Post Partum Day 1  Subjective:  Sabrina Sanchez is a 26 y.o. [id number] 1987w1d s/p PLTCS.  No acute events overnight.  Pt denies problems with po intake.  Pt has not ambulated or voided. She reports that every time she is up she gets shortness of breath. Has not gotten up overnight.  She denies nausea or vomiting.  Pain is well controlled.  She has not had flatus. She has not had bowel movement.  Lochia Small.  Plan for birth control is condoms, natural family planning (NFP).  Method of Feeding: Breast & Bottle feeding  Discussed with patient at length about blood transfusion and risks and benefits. Patient elected to get blood transfusion.   Objective: BP (!) 100/52 (BP Location: Left Arm)   Pulse 76   Temp 98.2 F (36.8 C) (Oral)   Resp 18   Ht 5\' 7"  (1.702 m)   LMP 02/04/2016   SpO2 98%   Breastfeeding? Unknown   BMI 33.36 kg/m   Physical Exam:  General: alert, cooperative and no distress Lochia: normal flow Chest: CTAB Heart: RRR, no m/r/g Abdomen: soft, tender at incision Uterine Fundus:  fundus firm at umbilicus Extremities: no edema   Recent Labs  10/08/16 0001 10/08/16 0616  HGB 7.0* 6.6*  HCT 22.5* 21.1*    Assessment/Plan:  ASSESSMENT: Sabrina Sanchez is a 26 y.o. [id number] 2587w1d ppd # 3 s/p PLTCS doing well.   Symptomatic Anemia - Hgb 6.6 this AM from 7.0 yesterday. Pt reports dizziness and SOB when getting up. - Small lochia  - discussed pRBC vs ferraheme and and benefits of transfusion with patient, pt elected for pRBC  POD#1 - continue current care   LOS: 1 day   Ardyth HarpsJohn Adeolu Keku Medical Student 10/08/2016, 7:17 AM   OB FELLOW POSTPARTUM PROGRESS NOTE ATTESTATION  I confirm that I have verified the information documented in the student's note and that I have also personally reperformed the physical exam and all medical decision making activities.   D/W patient risks and benefits and blood transfusion. Patients opts for blood transfusion. Possible fereheme  tomorrow depending on repsonse from 2 units PRBCs.     Ernestina PennaNicholas Schenk, MD 9:31 AM

## 2016-10-08 NOTE — Progress Notes (Signed)
CRITICAL VALUE ALERT  Critical Value:  hgb 6.6  Date & Time Notied:  10/08/16 [id number]  Provider Notified: Brayton CavesAbigail Lancaster, T/S resident notified @ [phone]  Orders Received/Actions taken: Resident said they would discuss it and make a decision. Oncoming RN aware.

## 2016-10-08 NOTE — Progress Notes (Signed)
Resident said to continue with morning CBC as ordered, and then they will reevaluate patient. She is aware the patient is not getting up at this time. Patient VS WDL, adequate foley output, and denies any concerns at this time. Patient updated on the current plan of care. Will continue to monitor. Royston CowperIsley, Meghana Tullo E, RN

## 2016-10-08 NOTE — Lactation Note (Signed)
This note was copied from a baby's chart. Lactation Consultation Note  Patient Name: Sabrina Sanchez [id number]'s Date: 10/08/2016 Reason for consult: Follow-up assessment;Late preterm infant;Infant < 6lbs;Multiple gestation  Triplets, Baby girl "A" in NICU. Mom reports that it is taking some time to latch baby girl "B" and "C" at the breast, but once they are latched, they nurse for about 20 minutes. Parents report that they are supplementing with formula after babies nurse. Mom reports that she has not used the DEBP yet. Offered to assist mom with using the DEBP and or hand expressing so that FOB can take EBM to baby "A" in NICU, but mom declined stating that she knows how to use the pump and will pump after she eats lunch. Mom is currently being transfused with blood. Enc mom to call for assistance as needed.  Mom requested coconut oil and it was given. Enc mom to continue putting babies to breast with cues and at least every 3 hours. Enc continuing to supplement after babies at breast, and post-pumping in order to protect her milk supply.    Maternal Data    Feeding Feeding Type: Formula Length of feed: 0 min  LATCH Score/Interventions                      Lactation Tools Discussed/Used     Consult Status Consult Status: Follow-up Date: 10/09/16 Follow-up type: In-patient    Sherlyn HayJennifer D Ardelle Haliburton 10/08/2016, 1:34 PM

## 2016-10-08 NOTE — Progress Notes (Signed)
Patient to the bathroom via steady. Patient stated that she voided a large amount, but not collected in the hat. Patient then ambulated to the wheelchair. No concerns and no signs of distress from mom. Pain medication administered, and mom and husband went to the NICU and babies to the nursery. Royston CowperIsley, Pattie Flaharty E, RN

## 2016-10-08 NOTE — Plan of Care (Signed)
Problem: Urinary Elimination: Goal: Ability to reestablish a normal urinary elimination pattern will improve Outcome: Not Met (add Reason) Due to symptomatic hypovolemia, the urinary catheter was not removed until 1830 after patient received two units of RBCs. Patient due to void spontaneously.

## 2016-10-09 LAB — CBC
HCT: 27.2 % — ABNORMAL LOW (ref 36.0–46.0)
HCT: 26.6 % — ABNORMAL LOW (ref 36.0–46.0)
HEMOGLOBIN: 8.7 g/dL — AB (ref 12.0–15.0)
Hemoglobin: 8.5 g/dL — ABNORMAL LOW (ref 12.0–15.0)
MCH: 24.6 pg — AB (ref 26.0–34.0)
MCH: 24.5 pg — ABNORMAL LOW (ref 26.0–34.0)
MCHC: 32 g/dL (ref 30.0–36.0)
MCHC: 32 g/dL (ref 30.0–36.0)
MCV: 76.8 fL — ABNORMAL LOW (ref 78.0–100.0)
MCV: 76.7 fL — ABNORMAL LOW (ref 78.0–100.0)
PLATELETS: 148 10*3/uL — AB (ref 150–400)
Platelets: 137 10*3/uL — ABNORMAL LOW (ref 150–400)
RBC: 3.54 MIL/uL — ABNORMAL LOW (ref 3.87–5.11)
RBC: 3.47 MIL/uL — AB (ref 3.87–5.11)
RDW: 18.8 % — AB (ref 11.5–15.5)
RDW: 19 % — AB (ref 11.5–15.5)
WBC: 9.3 10*3/uL (ref 4.0–10.5)
WBC: 10.3 10*3/uL (ref 4.0–10.5)

## 2016-10-09 LAB — TYPE AND SCREEN
ABO/RH(D): B POS
Antibody Screen: NEGATIVE
Unit division: 0
Unit division: 0

## 2016-10-09 LAB — BPAM RBC
BLOOD PRODUCT EXPIRATION DATE: 201805292359
BLOOD PRODUCT EXPIRATION DATE: 201805292359
ISSUE DATE / TIME: 201805231525
ISSUE DATE / TIME: 201805231207
Unit Type and Rh: 9500
Unit Type and Rh: 9500

## 2016-10-09 MED ORDER — POLYVINYL ALCOHOL 1.4 % OP SOLN
1.0000 [drp] | Freq: Four times a day (QID) | OPHTHALMIC | Status: DC | PRN
  Filled 2016-10-09: qty 15

## 2016-10-09 MED ORDER — FERROUS SULFATE 325 (65 FE) MG PO TABS
325.0000 mg | ORAL_TABLET | Freq: Every day | ORAL | Status: DC
  Filled 2016-10-09 (×2): qty 1

## 2016-10-09 MED ORDER — NAPHAZOLINE-GLYCERIN 0.012-0.2 % OP SOLN
1.0000 [drp] | Freq: Four times a day (QID) | OPHTHALMIC | Status: DC | PRN
  Filled 2016-10-09: qty 15

## 2016-10-09 NOTE — Progress Notes (Signed)
Post OP DAY # 2 Subjective: no complaints, up ad lib, voiding and tolerating PO, feeling better after transfusion  Objective: Blood pressure 108/68, pulse 85, temperature 98.7 F (37.1 C), temperature source Axillary, resp. rate 18, height 5\' 7"  (1.702 m), last menstrual period 02/04/2016, SpO2 99 %, unknown if currently breastfeeding.  Physical Exam:  General: alert Lochia: appropriate Uterine Fundus: firm Abdominal binder in place DVT Evaluation: No evidence of DVT seen on physical exam.   Recent Labs  10/08/16 1915 10/09/16 0528  HGB 8.7* 8.5*  HCT 27.2* 26.6*    Assessment/Plan: Plan for discharge tomorrow   LOS: 2 days   Julion Gatt C Judia Arnott 10/09/2016, 7:26 AM

## 2016-10-09 NOTE — Lactation Note (Signed)
This note was copied from a baby's chart. Lactation Consultation Note RN asked LC to consult d/t baby "B" a poor feeder. Mom didn't try to put baby "B or "C" to breast d/t mom saving colostrum for baby "A". Baby "B" is poor feeder w/bottle. Needs much stimulation to drink. Mom tried to to feed baby but wouldn't drink. LC attempted to formula feed, only took 7ml. Used gloved finger for suck training. Has weak suck w/little interest.  Reviewed [facility] care sheet w/supplementing amount and care. Mom forgot about that information sheet. Mom also using bottles w/formula for multiple uses. Discussed to be discarded after 1 hr. Informed RN, RN stated she had noticed that and told mom not to do that, and would discard,giving new formula. Mom stated she didn't know.  Encouraged to use DEBP for stimulation and supplementation.  Patient Name: Sabrina MatarGirlB Myrikal Sikkema [id number]'s Date: 10/09/2016 Reason for consult: Follow-up assessment;Infant < 6lbs;Late preterm infant   Maternal Data    Feeding Feeding Type: Bottle Fed - Formula Nipple Type: Slow - flow  LATCH Score/Interventions                      Lactation Tools Discussed/Used Tools: Pump Initiated by:: RN Date initiated:: 10/07/16   Consult Status Consult Status: Follow-up Date: 10/09/16 (in pm) Follow-up type: In-patient    Charyl DancerCARVER, Danicka Hourihan G 10/09/2016, 6:51 AM

## 2016-10-09 NOTE — Lactation Note (Signed)
This note was copied from a baby's chart. Lactation Consultation Note  Patient Name: Marcine MatarGirlB Santanna Cantrelle RUEAV'WToday's Date: 10/09/2016   Triplets 56 hours old, baby girl "A" in NICU. Mom resting and patient's bedside nurse, Debbie, RN and family member supplementing baby girl "B" and baby boy "C" formula by bottle.   Maternal Data    Feeding Feeding Type: Formula Nipple Type: Slow - flow  LATCH Score/Interventions                      Lactation Tools Discussed/Used     Consult Status      Sherlyn HayJennifer D Naveh Rickles 10/09/2016, 6:21 PM

## 2016-10-09 NOTE — Plan of Care (Signed)
Problem: Urinary Elimination: Goal: Ability to reestablish a normal urinary elimination pattern will improve Outcome: Completed/Met Date Met: 10/09/16 Patient has voided twice since foley removed. Has sensation, and able to ambulate to bathroom

## 2016-10-10 MED ORDER — OXYCODONE HCL 5 MG PO TABS: 5.0000 mg | ORAL_TABLET | Freq: Three times a day (TID) | ORAL | 0 refills | Status: DC | PRN

## 2016-10-10 MED ORDER — BACITRACIN-NEOMYCIN-POLYMYXIN OINTMENT TUBE
TOPICAL_OINTMENT | Freq: Every day | CUTANEOUS | Status: DC
  Filled 2016-10-10 (×2): qty 1

## 2016-10-10 NOTE — Discharge Summary (Signed)
Obstetric Discharge Summary Reason for Admission: cesarean section Prenatal Procedures: none Intrapartum Procedures: cesarean: low cervical, transverse Postpartum Procedures: transfusion 2U Complications-Operative and Postpartum: none  Sabrina EdmanKache Sanchez  has presented today for surgery, with the diagnosis of TRIPLETS - OB Fellow to assist  The various methods of treatment have been discussed with the patient and family. After consideration of risks, benefits and other options for treatment, the patient has consented to  Procedure(s) with comments: CESAREAN SECTION (N/A) - TRIPLETS as a surgical intervention .  The patient's history has been reviewed, patient examined, no change in status, stable for surgery.  I have reviewed the patient's chart and labs.  Questions were answered to the patient's satisfaction.    Hospital Course:  Active Problems:   Triplet gestation   Breech presentation with antenatal problem, fetus 1   Status post primary low transverse cesarean section   Sabrina Sanchez is a 26 y.o. [phone] s/p CS for triplets.  Patient was admitted for planned CS on 05/21 due to malpresentation of baby A.  She has postpartum course that was uncomplicated including no problems with ambulating, PO intake, urination, pain, or bleeding. The pt feels ready to go home and  will be discharged with outpatient follow-up.   Today: No acute events overnight.  Pt denies problems with ambulating, voiding or po intake.  She denies nausea or vomiting.  Pain is well controlled.  She has had flatus. She has not had bowel movement.  Lochia Moderate.  Plan for birth control is  condoms, natural family planning (NFP).  Method of Feeding: Breast and Bottle  Physical Exam:  General: alert, cooperative and no distress Lochia: appropriate Uterine Fundus: firm Incision: healing well, no significant drainage, no dehiscence DVT Evaluation: No evidence of DVT seen on physical exam.  H/H: Lab Results  Component Value  Date/Time   HGB 8.5 (L) 10/09/2016 05:28 AM   HCT 26.6 (L) 10/09/2016 05:28 AM   HCT 31.7 (L) 08/05/2016 09:06 AM    Discharge Diagnoses: Planned CS delivered  Discharge Information: Date: 10/10/2016 Activity: pelvic rest Diet: routine  Medications: PNV, Iron and Percocet Breast feeding:  Yes Condition: stable Instructions: refer to handout Discharge to: home   Discharge Instructions    Call MD for:  difficulty breathing, headache or visual disturbances    Complete by:  As directed    Call MD for:  extreme fatigue    Complete by:  As directed    Call MD for:  hives    Complete by:  As directed    Call MD for:  persistant dizziness or light-headedness    Complete by:  As directed    Call MD for:  persistant nausea and vomiting    Complete by:  As directed    Call MD for:  redness, tenderness, or signs of infection (pain, swelling, redness, odor or green/yellow discharge around incision site)    Complete by:  As directed    Call MD for:  severe uncontrolled pain    Complete by:  As directed    Call MD for:  temperature >100.4    Complete by:  As directed    Driving restriction     Complete by:  As directed    Avoid driving for at least 2 weeks.   Lifting restrictions    Complete by:  As directed    Weight restriction of 10 lbs.   Sexual acrtivity    Complete by:  As directed    Pelvic rest (no  intercourse or tampons) for six weeks     Allergies as of 10/10/2016   No Known Allergies     Medication List    STOP taking these medications   calcium carbonate 500 MG chewable tablet Commonly known as:  TUMS - dosed in mg elemental calcium   diphenhydrAMINE 25 MG tablet Commonly known as:  BENADRYL     TAKE these medications   IRON PO Take 1 tablet by mouth daily.   prenatal multivitamin Tabs tablet Take 1 tablet by mouth daily.        Tarri Abernethy ,MD 10/10/2016,7:59 AM   I have seen and examined this patient and agree with the management plan.

## 2016-10-10 NOTE — Lactation Note (Signed)
This note was copied from a baby's chart. Lactation Consultation Note  Patient Name: Sabrina Sanchez [id number] Date: 10/10/2016   Triplets, 13 hours old. Baby Girl "A" is in the NICU. Mom reports that she is putting Baby Girl "B" and Baby Boy "C" to breast with cues, and then supplementing with formula. Offered to assist with latching, but mom declined reporting that babies have just been fed and she believes they are nursing well. Mom reports that she is post-pumping and is seeing transitional milk. Mom reports that she has a personal DEBP at home. Discussed the benefits of hospital-grade pump, and mom aware of pumping rooms in NICU. Enc mom to take pumping kit to use with hospital pump. Discussed with mom that babies stay satisfied more with formula, so would need to pump more often and consider hospital-grade pump if babies not nursing with each feeding. Mom reports that babies usually nurse for at least 20 minutes.   Enc mom to call for assistance with latching Baby Girl "A" in NICU. Mom aware of OP/BFSG and [facility] phone line assistance after D/C. Enc mom to call for assistance with latching as needed.   Maternal Data    Feeding Feeding Type: Formula Length of feed: 10 min  LATCH Score/Interventions                      Lactation Tools Discussed/Used     Consult Status      Andres Labrum 10/10/2016, 10:45 AM

## 2016-10-14 ENCOUNTER — Ambulatory Visit: Payer: Self-pay

## 2016-10-14 ENCOUNTER — Encounter: Payer: BLUE CROSS/BLUE SHIELD | Admitting: Family Medicine

## 2016-10-14 NOTE — Lactation Note (Signed)
This note was copied from a baby's chart. Lactation Consultation Note  Patient Name: Sabrina Sanchez [id number] Date: 10/14/2016   NICU baby 68 days old. Mom requesting smaller flanges because her #24 are too large. Mom reports that her areolas are being pulled into the flange. Mom did not bring pump parts to the NICU, so enc mom to call for assistance as needed when she uses the #21 flanges. Enc mom to bring pumping kit to NICU and use pumping rooms after she visits with the baby. Mom states that 2 babies at home are nursing well and gaining weight.   Maternal Data    Feeding Feeding Type: Formula Length of feed: 45 min  LATCH Score/Interventions Latch: Grasps breast easily, tongue down, lips flanged, rhythmical sucking.  Audible Swallowing: Spontaneous and intermittent  Type of Nipple: Everted at rest and after stimulation  Comfort (Breast/Nipple): Soft / non-tender     Hold (Positioning): No assistance needed to correctly position infant at breast.  LATCH Score: 10  Lactation Tools Discussed/Used     Consult Status      Andres Labrum 10/14/2016, 4:47 PM

## 2016-10-22 DIAGNOSIS — D62 Acute posthemorrhagic anemia: Secondary | ICD-10-CM | POA: Diagnosis not present

## 2016-10-29 ENCOUNTER — Telehealth: Payer: Self-pay | Admitting: *Deleted

## 2016-10-29 NOTE — Telephone Encounter (Signed)
Pt delivered C/S on 10-07-16 and called with questions about her incision.  States she still has some of the steri strips present.  Informed pt that she could remove them at this time and reviewed incision care as well as signs of infection. Scheduled postpartum check for 11-18-16.

## 2016-11-18 ENCOUNTER — Encounter: Payer: Self-pay | Admitting: Family Medicine

## 2016-11-18 ENCOUNTER — Ambulatory Visit (INDEPENDENT_AMBULATORY_CARE_PROVIDER_SITE_OTHER): Payer: BLUE CROSS/BLUE SHIELD | Admitting: Family Medicine

## 2016-11-18 NOTE — Progress Notes (Signed)
Post Partum Exam  Sabrina EdmanKache Sanchez is a 26 y.o. [phone] 9964G4P3106 female who presents for a postpartum visit. She is 6 weeks postpartum following a low cervical transverse Cesarean section. I have fully reviewed the prenatal and intrapartum course. The delivery was at 3933w0d gestational weeks.  Anesthesia: epidural. Postpartum course has been unremarkable. Baby's course has been unremarkable, baby A was in NICU for 20 days. Babies are feeding by breast and bottle. Bleeding staining only. Bowel function is normal. Bladder function is normal. Patient is not sexually active. Contraception method is none. Postpartum depression screening:neg- score=1  The following portions of the patient's history were reviewed and updated as appropriate: allergies, current medications, past family history, past medical history, past social history, past surgical history and problem list.  Review of Systems Pertinent items noted in HPI and remainder of comprehensive ROS otherwise negative.    Objective:  unknown if currently breastfeeding.  General:  alert, cooperative and appears stated age  Lungs: normal effort  Heart:  regular rate and rhythm  Abdomen: soft, non-tender; bowel sounds normal; no masses,  no organomegaly and incision is well healed, large diastasis        Assessment:     Normal postpartum exam. Pap smear done at today's visit.   Plan:   1. Contraception: none 2. Pap due 12/18 3. Follow up in: 5 months or as needed.

## 2016-11-28 NOTE — Anesthesia Postprocedure Evaluation (Signed)
Anesthesia Post Note  Patient: Sabrina Sanchez  Procedure(s) Performed: Procedure(s) (LRB): CESAREAN SECTION MULTI-GESTATIONAL (N/A)     Anesthesia Post Evaluation  Last Vitals:  Vitals:   10/10/16 0528 10/10/16 0944  BP: 118/76 (!) 121/59  Pulse: 63 97  Resp: 20 18  Temp: 36.8 C 36.9 C    Last Pain:  Vitals:   10/10/16 1159  TempSrc:   PainSc: 3                  Ladaja Yusupov EDWARD

## 2016-11-28 NOTE — Addendum Note (Signed)
Addendum  created 11/28/16 1306 by Modena Bellemare, MD   Sign clinical note    

## 2017-03-26 ENCOUNTER — Encounter: Payer: Self-pay | Admitting: Radiology

## 2018-06-18 DIAGNOSIS — M9902 Segmental and somatic dysfunction of thoracic region: Secondary | ICD-10-CM | POA: Diagnosis not present

## 2018-06-18 DIAGNOSIS — M9903 Segmental and somatic dysfunction of lumbar region: Secondary | ICD-10-CM | POA: Diagnosis not present

## 2018-06-18 DIAGNOSIS — M5386 Other specified dorsopathies, lumbar region: Secondary | ICD-10-CM | POA: Diagnosis not present

## 2018-06-18 DIAGNOSIS — M9904 Segmental and somatic dysfunction of sacral region: Secondary | ICD-10-CM | POA: Diagnosis not present

## 2018-06-23 DIAGNOSIS — M5386 Other specified dorsopathies, lumbar region: Secondary | ICD-10-CM | POA: Diagnosis not present

## 2018-06-23 DIAGNOSIS — M9902 Segmental and somatic dysfunction of thoracic region: Secondary | ICD-10-CM | POA: Diagnosis not present

## 2018-06-23 DIAGNOSIS — M9904 Segmental and somatic dysfunction of sacral region: Secondary | ICD-10-CM | POA: Diagnosis not present

## 2018-06-23 DIAGNOSIS — M9903 Segmental and somatic dysfunction of lumbar region: Secondary | ICD-10-CM | POA: Diagnosis not present

## 2018-06-25 DIAGNOSIS — M9903 Segmental and somatic dysfunction of lumbar region: Secondary | ICD-10-CM | POA: Diagnosis not present

## 2018-06-25 DIAGNOSIS — M5386 Other specified dorsopathies, lumbar region: Secondary | ICD-10-CM | POA: Diagnosis not present

## 2018-06-25 DIAGNOSIS — M9904 Segmental and somatic dysfunction of sacral region: Secondary | ICD-10-CM | POA: Diagnosis not present

## 2018-06-25 DIAGNOSIS — M9902 Segmental and somatic dysfunction of thoracic region: Secondary | ICD-10-CM | POA: Diagnosis not present

## 2018-08-12 ENCOUNTER — Other Ambulatory Visit: Payer: BLUE CROSS/BLUE SHIELD

## 2018-08-12 ENCOUNTER — Other Ambulatory Visit: Payer: Self-pay

## 2018-08-12 ENCOUNTER — Encounter: Payer: Self-pay | Admitting: Family Medicine

## 2018-08-12 ENCOUNTER — Ambulatory Visit (INDEPENDENT_AMBULATORY_CARE_PROVIDER_SITE_OTHER): Payer: BLUE CROSS/BLUE SHIELD | Admitting: Family Medicine

## 2018-08-12 DIAGNOSIS — T732XXA Exhaustion due to exposure, initial encounter: Secondary | ICD-10-CM

## 2018-08-12 DIAGNOSIS — N939 Abnormal uterine and vaginal bleeding, unspecified: Secondary | ICD-10-CM

## 2018-08-12 NOTE — Assessment & Plan Note (Signed)
Check her TSH 

## 2018-08-12 NOTE — Assessment & Plan Note (Signed)
Will check TSH due to nature of complaints and fatigue.

## 2018-08-12 NOTE — Progress Notes (Signed)
   TELEHEALTH VIRTUAL GYNECOLOGY VISIT ENCOUNTER NOTE  I connected with Sabrina Sanchez on 08/12/18 at 11:00 AM EDT by telephone at home and verified that I am speaking with the correct person using two identifiers.   I discussed the limitations, risks, security and privacy concerns of performing an evaluation and management service by telephone and the availability of in person appointments. I also discussed with the patient that there may be a patient responsible charge related to this service. The patient expressed understanding and agreed to proceed.   History:  Sabrina Sanchez is a 28 y.o. [phone] female being evaluated today for feeling off balance. Had a hard time with recovery. Tried to give her body some time to recover. Her cycles are very different. They are lasting 2 weeks. Has spotting x 4 days, then has heavy flow followed by more spotting. Spotting is more brown or then pink. Notes her hair is also coming out a lot. Notes some painful cramping which is mostly worse in her back. Also notes she feels very tired. She denies any abnormal vaginal discharge, bleeding, pelvic pain or other concerns.       Past Medical History:  Diagnosis Date  . Fracture of wrist AGE 67 AND AGE 67  . Infection    UTI X 1  . Infection    OCC YEAST   Past Surgical History:  Procedure Laterality Date  . CESAREAN SECTION MULTI-GESTATIONAL N/A 10/07/2016   Procedure: CESAREAN SECTION MULTI-GESTATIONAL;  Surgeon: Reva Bores, MD;  Location: [facility] BIRTHING SUIT[facility];  Service: Obstetrics;  Laterality: N/A;  . EYE SURGERY  AGE 11   The following portions of the patient's history were reviewed and updated as appropriate: allergies, current medications, past family history, past medical history, past social history, past surgical history and problem list.   Health Maintenance:  Normal pap on 2014.    Review of Systems:  Pertinent items noted in HPI and remainder of comprehensive ROS otherwise negative.  Physical Exam:   Physical exam deferred due to nature of the encounter  Assessment and Plan:           Problem List Items Addressed This Visit      Unprioritized   Abnormal uterine bleeding    Will check TSH due to nature of complaints and fatigue.      Fatigue due to exposure - Primary    Check her TSH        If all is negative, consider u/s--cannot be done right now anyway.  I discussed the assessment and treatment plan with the patient. The patient was provided an opportunity to ask questions and all were answered. The patient agreed with the plan and demonstrated an understanding of the instructions.   The patient was advised to call back or seek an in-person evaluation/go to the ED if the symptoms worsen or if the condition fails to improve as anticipated.  I provided 10 minutes of non-face-to-face time during this encounter.   Reva Bores, MD [facility] [employer], [facility]

## 2018-08-13 LAB — CBC
HEMATOCRIT: 38.4 % (ref 34.0–46.6)
HEMOGLOBIN: 11.7 g/dL (ref 11.1–15.9)
MCH: 22.5 pg — ABNORMAL LOW (ref 26.6–33.0)
MCHC: 30.5 g/dL — ABNORMAL LOW (ref 31.5–35.7)
MCV: 74 fL — ABNORMAL LOW (ref 79–97)
Platelets: 259 10*3/uL (ref 150–450)
RBC: 5.21 x10E6/uL (ref 3.77–5.28)
RDW: 15.9 % — AB (ref 11.7–15.4)
WBC: 9.1 10*3/uL (ref 3.4–10.8)

## 2018-08-13 LAB — TSH: TSH: 1.01 u[IU]/mL (ref 0.450–4.500)

## 2018-08-17 ENCOUNTER — Telehealth: Payer: Self-pay | Admitting: *Deleted

## 2018-08-17 ENCOUNTER — Other Ambulatory Visit: Payer: Self-pay | Admitting: *Deleted

## 2018-08-17 DIAGNOSIS — N939 Abnormal uterine and vaginal bleeding, unspecified: Secondary | ICD-10-CM

## 2018-08-17 NOTE — Telephone Encounter (Signed)
Called patient to inform her of her lab results and that we would get her scheduled for an ultrasound, and I will inform MD of pt not wanting anything for bleeding at this time.   Scheryl Marten, RN

## 2018-08-17 NOTE — Telephone Encounter (Signed)
-----   Message from Reva Bores, MD sent at 08/13/2018  7:57 AM EDT ----- Can you let her know her labs overall are normal--it is not her thyroid. We will need to get an ultrasound--please call and schedule--though they are not doing any for at least four weeks. Ask if she wants something to help her bleeding?

## 2018-09-16 ENCOUNTER — Other Ambulatory Visit: Payer: Self-pay

## 2018-09-16 ENCOUNTER — Ambulatory Visit (HOSPITAL_COMMUNITY)
Admission: RE | Admit: 2018-09-16 | Discharge: 2018-09-16 | Disposition: A | Discharge status: Home / Self Care | Payer: BLUE CROSS/BLUE SHIELD | Source: Ambulatory Visit | Attending: Family Medicine | Admitting: Family Medicine

## 2018-09-16 DIAGNOSIS — N939 Abnormal uterine and vaginal bleeding, unspecified: Secondary | ICD-10-CM | POA: Insufficient documentation

## 2018-09-20 ENCOUNTER — Telehealth: Payer: Self-pay | Admitting: *Deleted

## 2018-09-20 MED ORDER — MEGESTROL ACETATE 40 MG PO TABS: 40.0000 mg | ORAL_TABLET | Freq: Two times a day (BID) | ORAL | 0 refills | Status: AC

## 2018-09-20 NOTE — Telephone Encounter (Signed)
Called pt informed of ultrasound results and Dr Shawnie Pons want to try a medication and if that doesn't help to come in for an in person visit.

## 2018-09-20 NOTE — Telephone Encounter (Signed)
RX sent into pharmacy

## 2018-09-20 NOTE — Telephone Encounter (Signed)
-----   Message from Reva Bores, MD sent at 09/17/2018  9:42 AM EDT ----- Just some fluid in her canal--let's try 10 days of Megace with her next cycle and see if that helps--otherwise schedule for in person visit, soon.

## 2018-12-28 IMAGING — US US MFM OB DETAIL EACH ADDL GEST+14 WK
1 series · 14 of 28 positions shown · non-contrast
Comparison: none

[Series 1: us mfm ob detail each addl gest+14 wk · 195 acquisitions, 14 frames shown]
[im 8/195]
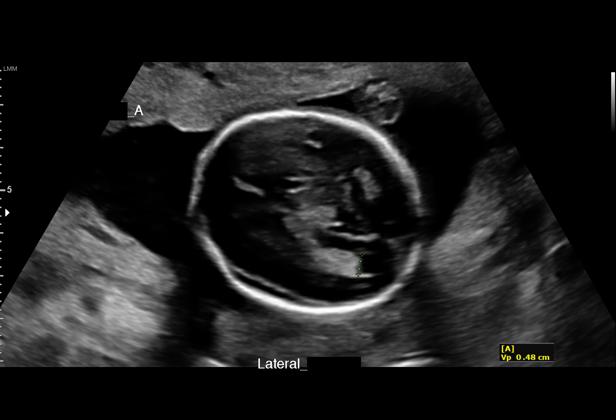
[im 22/195]
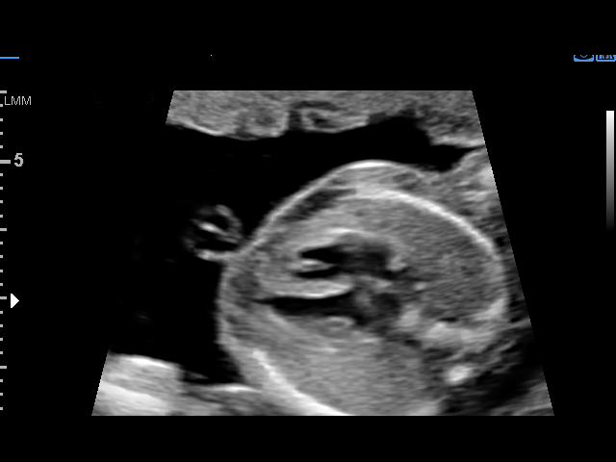
[im 36/195]
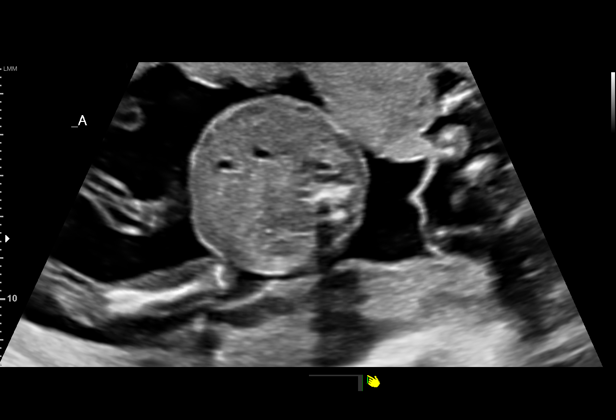
[im 51/195]
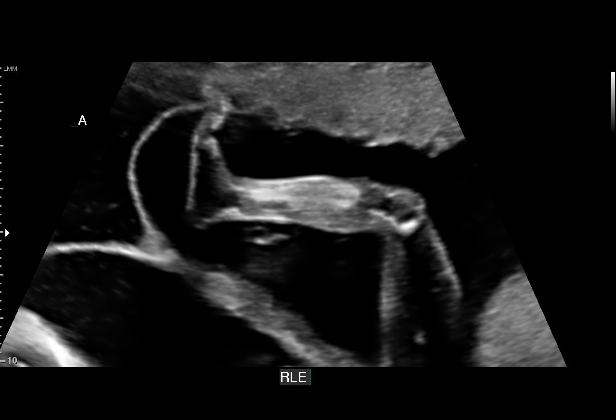
[im 65/195]
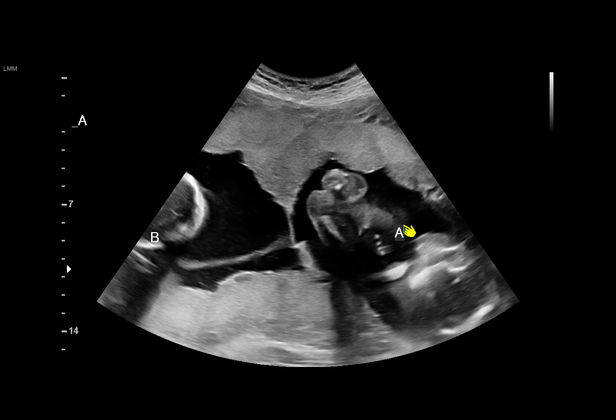
[im 80/195]
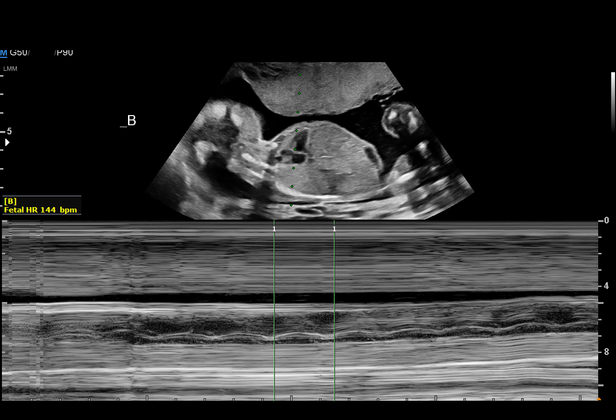
[im 94/195]
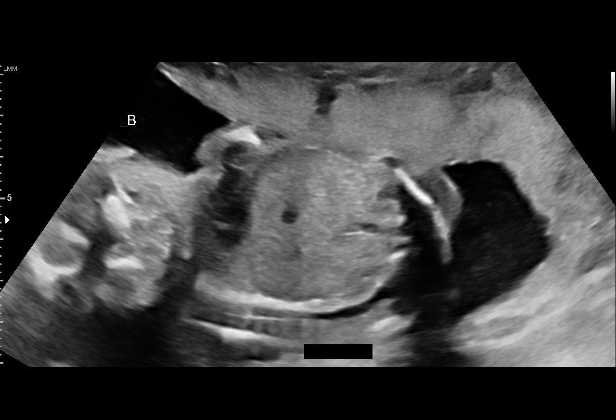
[im 108/195]
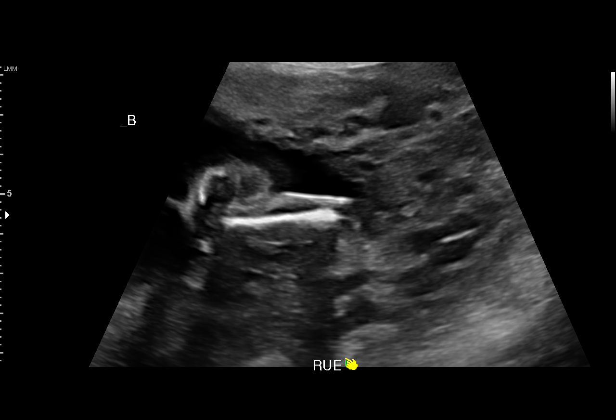
[im 123/195]
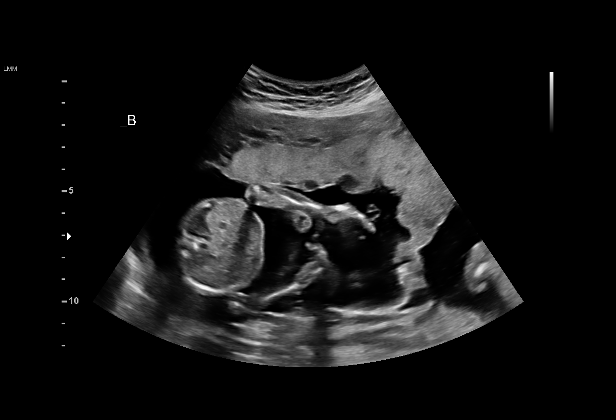
[im 137/195]
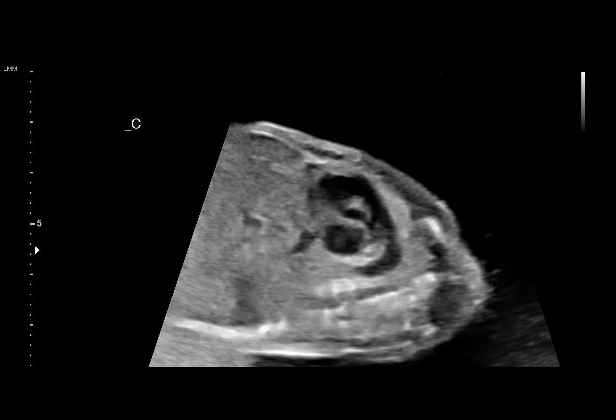
[im 151/195]
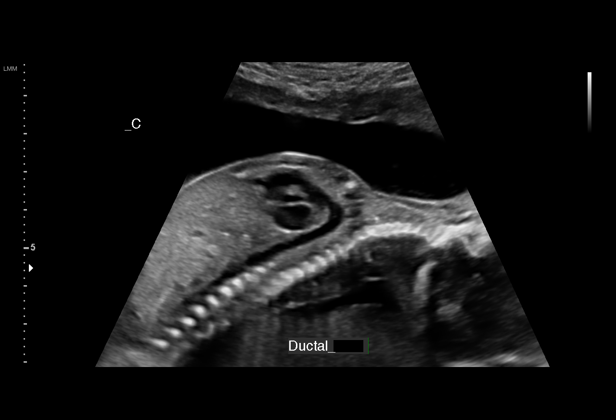
[im 166/195]
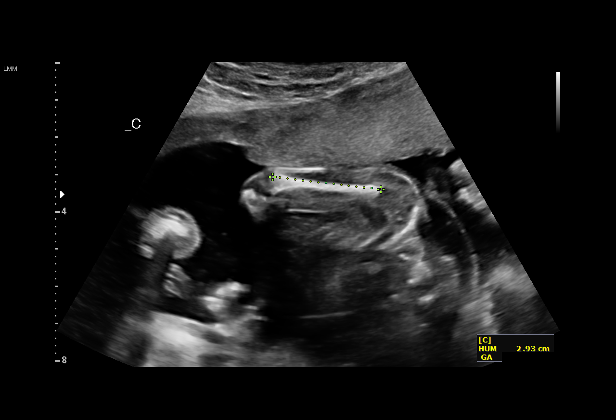
[im 180/195]
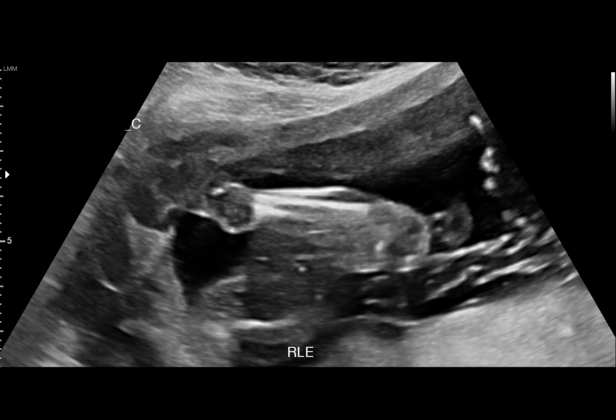
[im 195/195]
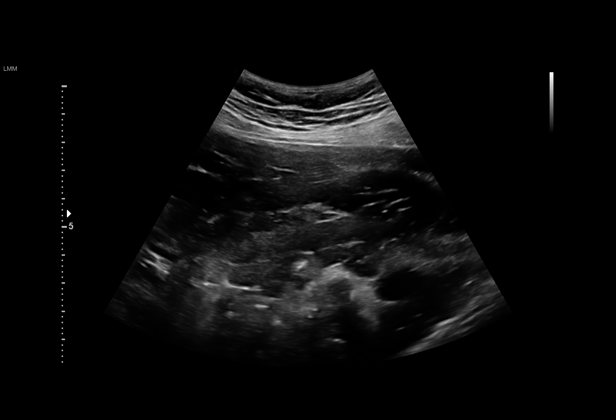

[14 of 28 positions shown; findings below may reference images not displayed]

[REDACTED]

WK
WK

1  SAVIO LOCKLEAR           [id number]      [id number]     [id number]
2  SAVIO LOCKLEAR           [id number]      [id number]     [id number]
3  SAVIO LOCKLEAR           [id number]      [id number]     [id number]
Indications

19 weeks gestation of pregnancy
Triplet gestation
Encounter for fetal anatomic survey
Fetal Evaluation (Fetus A)

Num Of Fetuses:     3
Fetal Heart         151
Rate(bpm):
Cardiac Activity:   Observed
Fetal Lie:          Lower Fetus
Presentation:       Cephalic
Placenta:           Anterior, above cervical os
P. Cord Insertion:  Visualized, central
Membrane Desc:      Dividing Membrane seen

Amniotic Fluid
AFI FV:      Subjectively within normal limits

Largest Pocket(cm)
7.14
Biometry (Fetus A)

BPD:      45.8  mm     G. Age:  19w 6d         62  %    CI:        79.28   %   70 - 86
FL/HC:      18.6   %   16.8 -
HC:      162.6  mm     G. Age:  19w 0d         20  %    HC/AC:      1.15       1.09 -
AC:       142   mm     G. Age:  19w 4d         45  %    FL/BPD:     66.2   %
FL:       30.3  mm     G. Age:  19w 3d         36  %    FL/AC:      21.3   %   20 - 24
HUM:      30.1  mm     G. Age:  20w 0d         61  %
CER:      18.1  mm     G. Age:  18w 0d        < 5  %
NFT:       3.1  mm

CM:          4  mm

Est. FW:     293  gm    0 lb 10 oz      45  %     FW Discordancy         3  %
Gestational Age (Fetus A)

LMP:           18w 3d       Date:   02/05/16                 EDD:   11/11/16
U/S Today:     19w 3d                                        EDD:   11/04/16
Best:          19w 4d    Det. By:   Early Ultrasound         EDD:   11/03/16
(05/05/16)
Anatomy (Fetus A)

Cranium:               Appears normal         Aortic Arch:            Appears normal
Cavum:                 Appears normal         Ductal Arch:            Appears normal
Ventricles:            Appears normal         Diaphragm:              Appears normal
Choroid Plexus:        Appears normal         Stomach:                Appears normal, left
sided
Cerebellum:            Appears normal         Abdomen:                Appears normal
Posterior Fossa:       Appears normal         Abdominal Wall:         Appears nml (cord
insert, abd wall)
Nuchal Fold:           Appears normal         Cord Vessels:           Appears normal (3
vessel cord)
Face:                  Appears normal         Kidneys:                Appear normal
(orbits and profile)
Lips:                  Appears normal         Bladder:                Appears normal
Thoracic:              Appears normal         Spine:                  Not well visualized
Heart:                 Appears normal         Upper Extremities:      Appears normal
(4CH, axis, and situs
RVOT:                  Appears normal         Lower Extremities:      Appears normal
LVOT:                  Appears normal

Other:  Heels and 5th digit appears normal. Fetus appears to be a female.
Technically difficult due to fetal position.

Fetal Evaluation (Fetus B)

Num Of Fetuses:     3
Fetal Heart         144
Rate(bpm):
Cardiac Activity:   Observed
Fetal Lie:          Maternal left side
Presentation:       Breech
Placenta:           Anterior, above cervical os
P. Cord Insertion:  Visualized, central
Membrane Desc:      Dividing Membrane seen
Amniotic Fluid
AFI FV:      Subjectively within normal limits

Largest Pocket(cm)
6.54
Biometry (Fetus B)

BPD:      44.7  mm     G. Age:  19w 4d         48  %    CI:        74.72   %   70 - 86
FL/HC:      18.5   %   16.8 -
HC:      164.1  mm     G. Age:  19w 1d         24  %    HC/AC:      1.20       1.09 -
AC:      136.3  mm     G. Age:  19w 0d         29  %    FL/BPD:     68.0   %
FL:       30.4  mm     G. Age:  19w 3d         37  %    FL/AC:      22.3   %   20 - 24
HUM:      29.9  mm     G. Age:  19w 6d         59  %
CER:      18.3  mm     G. Age:  18w 1d          8  %
NFT:       3.4  mm
CM:        3.2  mm

Est. FW:     281  gm    0 lb 10 oz      40  %     FW Discordancy         7  %
Gestational Age (Fetus B)

LMP:           18w 3d       Date:   02/05/16                 EDD:   11/11/16
U/S Today:     19w 2d                                        EDD:   11/05/16
Best:          19w 4d    Det. By:   Early Ultrasound         EDD:   11/03/16
(05/05/16)
Anatomy (Fetus B)

Cranium:               Appears normal         Aortic Arch:            Appears normal
Cavum:                 Appears normal         Ductal Arch:            Not well visualized
Ventricles:            Appears normal         Diaphragm:              Appears normal
Choroid Plexus:        Appears normal         Stomach:                Appears normal, left
sided
Cerebellum:            Appears normal         Abdomen:                Appears normal
Posterior Fossa:       Appears normal         Abdominal Wall:         Appears nml (cord
insert, abd wall)
Nuchal Fold:           Appears normal         Cord Vessels:           Appears normal (3
vessel cord)
Face:                  Orbits appear          Kidneys:                Appear normal
normal
Lips:                  Appears normal         Bladder:                Appears normal
Thoracic:              Appears normal         Spine:                  Appears normal
Heart:                 Appears normal         Upper Extremities:      Appears normal
(4CH, axis, and situs
RVOT:                  Not well visualized    Lower Extremities:      Appears normal
LVOT:                  Previously seen

Other:  Heels and 5th digit appears normal. Fetus appears to be a female.
Technically difficult due to fetal position.

Fetal Evaluation (Fetus C)

Num Of Fetuses:     3
Fetal Heart         151
Rate(bpm):
Cardiac Activity:   Observed
Fetal Lie:          Right Fetus
Presentation:       Cephalic
Placenta:           Posterior, above cervical os
P. Cord Insertion:  Visualized, central
Membrane Desc:      Dividing Membrane seen

Amniotic Fluid
AFI FV:      Subjectively within normal limits

Largest Pocket(cm)
6.5
Biometry (Fetus C)

BPD:      46.4  mm     G. Age:  20w 0d         70  %    CI:         76.4   %   70 - 86
FL/HC:      18.5   %   16.8 -
HC:      168.2  mm     G. Age:  19w 4d         38  %    HC/AC:      1.19       1.09 -
AC:      141.2  mm     G. Age:  19w 4d         43  %    FL/BPD:     67.2   %
FL:       31.2  mm     G. Age:  19w 5d         47  %    FL/AC:      22.1   %   20 - 24
HUM:      29.1  mm     G. Age:  19w 3d         50  %
CER:      19.6  mm     G. Age:  18w 6d         32  %
NFT:       3.3  mm
CM:        6.4  mm

Est. FW:     301  gm    0 lb 11 oz      48  %     FW Discordancy      0 \ 7 %
Gestational Age (Fetus C)

LMP:           18w 3d       Date:   02/05/16                 EDD:   11/11/16
U/S Today:     19w 5d                                        EDD:   11/02/16
Best:          19w 4d    Det. By:   Early Ultrasound         EDD:   11/03/16
(05/05/16)
Anatomy (Fetus C)

Cranium:               Appears normal         Aortic Arch:            Appears normal
Cavum:                 Appears normal         Ductal Arch:            Appears normal
Ventricles:            Appears normal         Diaphragm:              Appears normal
Choroid Plexus:        Appears normal         Stomach:                Appears normal, left
sided
Cerebellum:            Appears normal         Abdomen:                Appears normal
Posterior Fossa:       Appears normal         Abdominal Wall:         Appears nml (cord
insert, abd wall)
Nuchal Fold:           Appears normal         Cord Vessels:           Appears normal (3
vessel cord)
Face:                  Profile appears        Kidneys:                Appear normal
normal
Lips:                  Appears normal         Bladder:                Appears normal
Thoracic:              Appears normal         Spine:                  Not well visualized
Heart:                 Appears normal         Upper Extremities:      Appears normal
(4CH, axis, and situs
RVOT:                  Appears normal         Lower Extremities:      Appears normal
LVOT:                  Appears normal

Other:  Heels and 5th digit appears normal. Fetus appears to be a male.
Technically difficult due to fetal position.
Cervix Uterus Adnexa

Cervix
Length:            4.6  cm.
Normal appearance by transabdominal scan.
Uterus
No abnormality visualized.

Left Ovary
Not visualized.

Right Ovary
Not visualized.

Cul De Sac:   No free fluid seen.

Adnexa:       No abnormality visualized.
Impression

Trichorionic/ Triamniotic triplet gestation at 19+4 weeks
Baby A: cephalic, anterior placenta. Normal movement and
cardiac activity
No sonographic markers for aneuploidy or structrual
anomalies are seen; spine was suboptimally visualized
Growth is normal in the 45th percentile
Normal amniotic fluid level
Baby B:: breechanterior placenta. Normal movement and
cardiac activity
No sonographic markers for aneuploidy or structrual
anomalies are seen; RVOT and ductal arch were
suboptimally visualized
Growth is normal in the 40th percentile
Normal amniotic fluid level
Baby C: cephalic, posterior placenta. Normal movement and
cardiac activity
No sonographic markers for aneuploidy or structrual
anomalies are seen; spine was suboptimally visualized
Growth is normal in the 48th percentile.
Normal amniotic fluid level
Cervical length is 46mm transabdominally
There is 7% discordance between the largest and the
smallest triplets
Recommendations

Plan repeat evaluation of the babies i 4 weeks.

## 2019-02-26 IMAGING — US US MFM OB FOLLOW-UP
1 series · 14 of 28 positions shown · non-contrast
Comparison: none

[Series 1: us mfm ob follow-up · 14 of 122 slices shown]
[im 5/122]
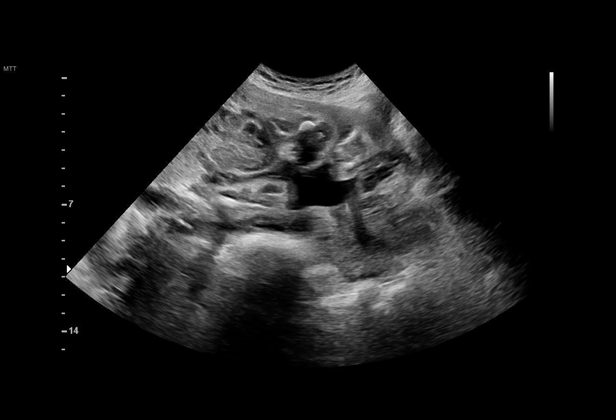
[im 14/122]
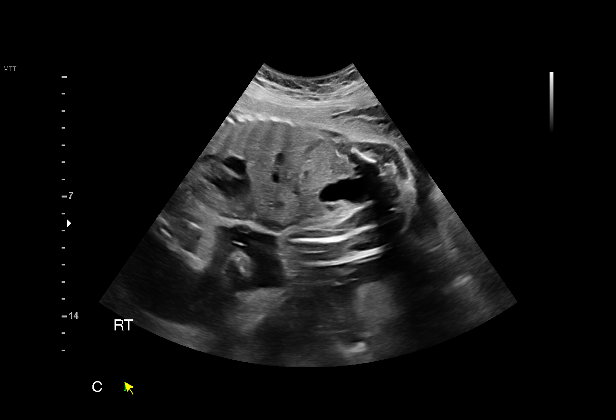
[im 23/122]
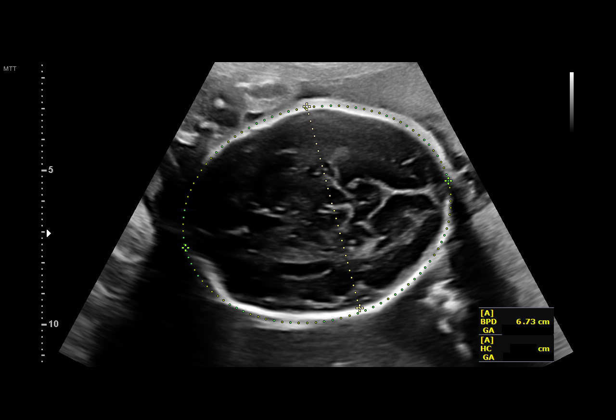
[im 32/122]
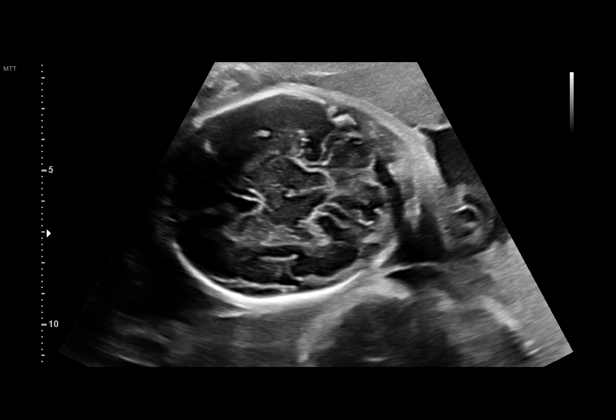
[im 41/122]
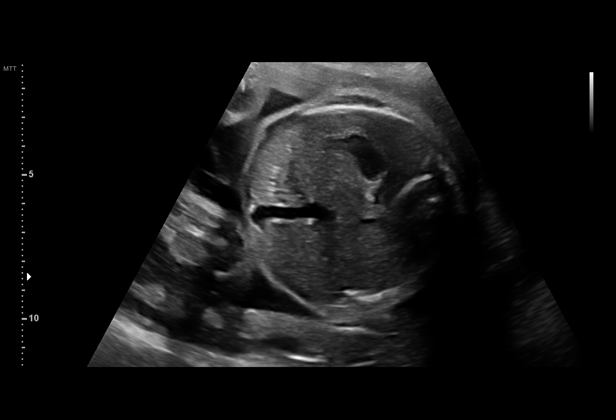
[im 50/122]
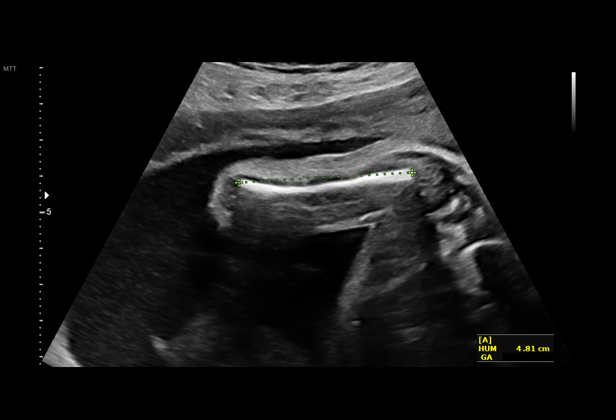
[im 59/122]
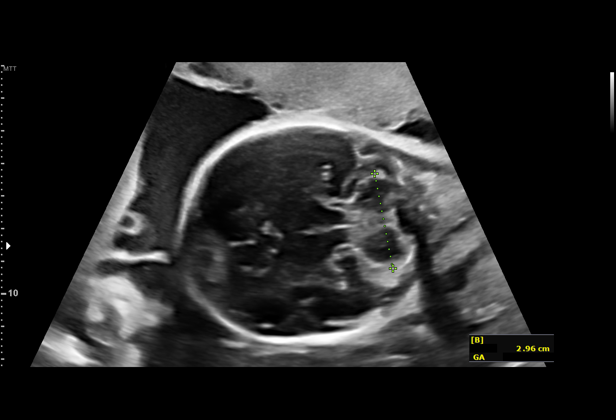
[im 68/122]
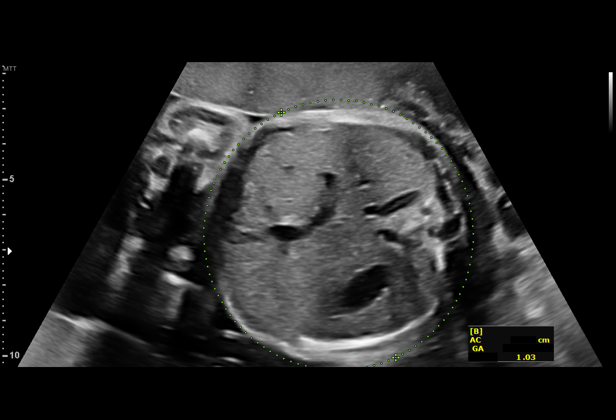
[im 77/122]
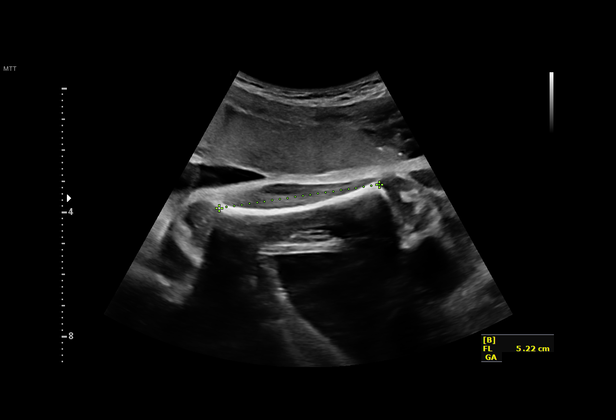
[im 86/122]
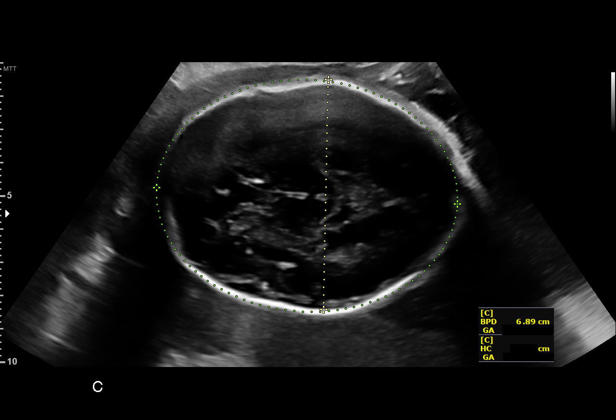
[im 95/122]
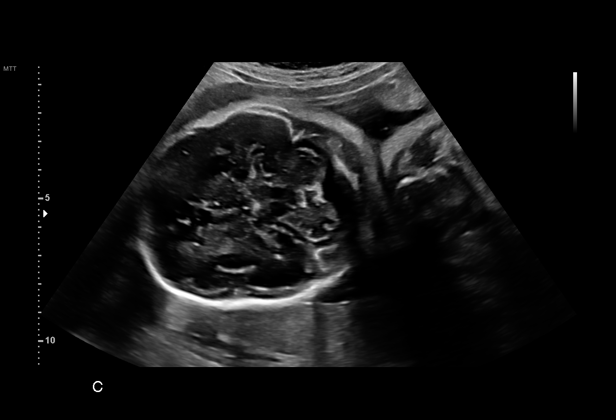
[im 104/122]
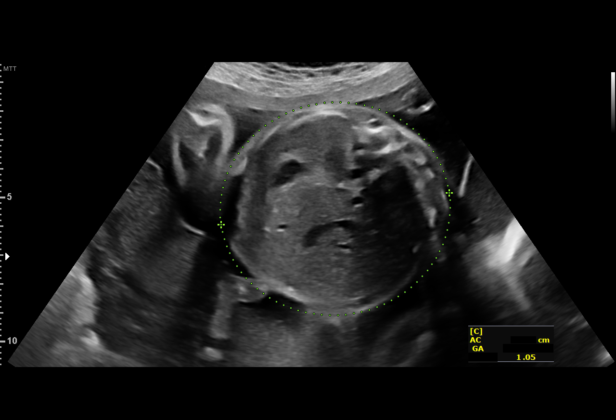
[im 113/122]
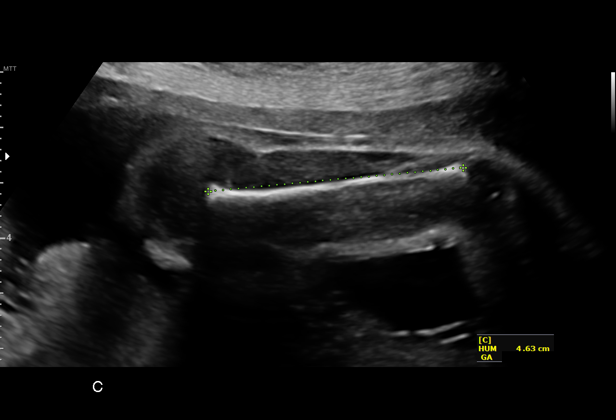
[im 122/122]
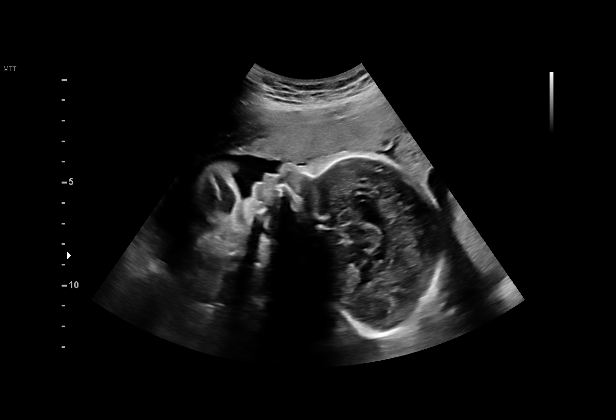

[14 of 28 positions shown; findings below may reference images not displayed]

[REDACTED]

1  QUANG JOHNSEN            [id number]      [id number]     [id number]
2  QUANG JOHNSEN            [id number]      [id number]     [id number]
3  QUANG JOHNSEN            [id number]      [id number]     [id number]
Indications

28 weeks gestation of pregnancy
Triplet gestation
Encounter for other antenatal screening
follow-up
Fetal Evaluation (Fetus A)

Num Of Fetuses:     3
Fetal Heart         132
Rate(bpm):
Cardiac Activity:   Observed
Fetal Lie:          Lower Fetus
Presentation:       Breech
Placenta:           Anterior, above cervical os
P. Cord Insertion:  Previously Visualized
Membrane Desc:      Dividing Membrane seen

Amniotic Fluid
AFI FV:      Subjectively within normal limits

Largest Pocket(cm)
8.8
Biometry (Fetus A)

BPD:      67.4  mm     G. Age:  27w 1d         12  %    CI:        75.06   %   70 - 86
FL/HC:      22.9   %   18.8 -
HC:      246.8  mm     G. Age:  26w 5d        < 3  %    HC/AC:      0.99       1.05 -
AC:      248.1  mm     G. Age:  29w 0d         69  %    FL/BPD:     83.7   %   71 - 87
FL:       56.4  mm     G. Age:  29w 5d         77  %    FL/AC:      22.7   %   20 - 24
HUM:      48.7  mm     G. Age:  28w 5d         55  %
CER:      30.5  mm     G. Age:  26w 5d         25  %
CM:        6.4  mm

Est. FW:    5453  gm    2 lb 14 oz      65  %     FW Discordancy     0 \ 10 %
Gestational Age (Fetus A)

LMP:           27w 0d       Date:   02/05/16                 EDD:   11/11/16
U/S Today:     28w 1d                                        EDD:   11/03/16
Best:          28w 1d    Det. By:   Early Ultrasound         EDD:   11/03/16
(05/05/16)
Anatomy (Fetus A)

Cranium:               Appears normal         Aortic Arch:            Previously seen
Cavum:                 Previously seen        Ductal Arch:            Previously seen
Ventricles:            Appears normal         Diaphragm:              Appears normal
Choroid Plexus:        Previously seen        Stomach:                Appears normal, left
sided
Cerebellum:            Appears normal         Abdomen:                Previously seen
Posterior Fossa:       Previously seen        Abdominal Wall:         Previously seen
Nuchal Fold:           Previously seen        Cord Vessels:           Previously seen
Face:                  Orbits and profile     Kidneys:                Appear normal
previously seen
Lips:                  Previously seen        Bladder:                Appears normal
Thoracic:              Previously seen        Spine:                  Appears normal
Heart:                 Previously seen        Upper Extremities:      Previously seen
RVOT:                  Previously seen        Lower Extremities:      Previously seen
LVOT:                  Previously seen

Other:  Heels and 5th digit previously seen.  Female gender previously seen.
Technically difficult due to fetal position.

Fetal Evaluation (Fetus B)

Num Of Fetuses:     3
Fetal Heart         144
Rate(bpm):
Cardiac Activity:   Observed
Fetal Lie:          Maternal left side
Presentation:       Cephalic
Placenta:           Anterior, above cervical os
P. Cord Insertion:  Previously Visualized
Membrane Desc:      Dividing Membrane seen

Amniotic Fluid
AFI FV:      Subjectively within normal limits

Largest Pocket(cm)
5.92
Biometry (Fetus B)

BPD:      67.1  mm     G. Age:  27w 0d         10  %    CI:        73.13   %   70 - 86
FL/HC:      21.4   %   18.8 -
HC:      249.4  mm     G. Age:  27w 0d          4  %    HC/AC:      1.03       1.05 -
AC:      242.2  mm     G. Age:  28w 4d         53  %    FL/BPD:     79.6   %   71 - 87
FL:       53.4  mm     G. Age:  28w 3d         41  %    FL/AC:      22.0   %   20 - 24
HUM:        49  mm     G. Age:  28w 6d         58  %
CER:      30.3  mm     G. Age:  26w 5d         22  %
CM:        4.8  mm

Est. FW:    1198  gm    2 lb 10 oz      52  %     FW Discordancy         8  %
Gestational Age (Fetus B)

LMP:           27w 0d       Date:   02/05/16                 EDD:   11/11/16
U/S Today:     27w 5d                                        EDD:   11/06/16
Best:          28w 1d    Det. By:   Early Ultrasound         EDD:   11/03/16
(05/05/16)
Anatomy (Fetus B)

Cranium:               Appears normal         Aortic Arch:            Previously seen
Cavum:                 Previously seen        Ductal Arch:            Previously seen
Ventricles:            Appears normal         Diaphragm:              Appears normal
Choroid Plexus:        Previously seen        Stomach:                Appears normal, left
sided
Cerebellum:            Appears normal         Abdomen:                Previously seen
Posterior Fossa:       Previously seen        Abdominal Wall:         Previously seen
Nuchal Fold:           Previously seen        Cord Vessels:           Previously seen
Face:                  Orbits and profile     Kidneys:                Appear normal
previously seen
Lips:                  Previously seen        Bladder:                Appears normal
Thoracic:              Previously seen        Spine:                  Previously seen
Heart:                 Previously seen        Upper Extremities:      Previously seen
RVOT:                  Previously seen        Lower Extremities:      Previously seen
LVOT:                  Previously seen

Other:  Heels and 5th digit previously seen.  Female gender previously seen.
Technically difficult due to fetal position.

Fetal Evaluation (Fetus C)

Num Of Fetuses:     3
Fetal Heart         149
Rate(bpm):
Cardiac Activity:   Observed
Fetal Lie:          Right Fetus
Presentation:       Breech
Placenta:           Posterior, above cervical os
P. Cord Insertion:  Previously Visualized
Membrane Desc:      Dividing Membrane seen

Amniotic Fluid
AFI FV:      Subjectively within normal limits

Largest Pocket(cm)
5.64
Biometry (Fetus C)

BPD:      69.2  mm     G. Age:  27w 5d         28  %    CI:        76.21   %   70 - 86
FL/HC:      20.8   %   18.8 -
HC:      251.2  mm     G. Age:  27w 2d          6  %    HC/AC:      1.04       1.05 -
AC:      240.9  mm     G. Age:  28w 3d         49  %    FL/BPD:     75.4   %   71 - 87
FL:       52.2  mm     G. Age:  27w 6d         26  %    FL/AC:      21.7   %   20 - 24
HUM:      46.3  mm     G. Age:  27w 2d         28  %
CER:      33.2  mm     G. Age:  29w 1d         62  %
CM:        7.9  mm

Est. FW:    1113  gm      2 lb 9 oz     49  %     FW Discordancy        10  %
Gestational Age (Fetus C)

LMP:           27w 0d       Date:   02/05/16                 EDD:   11/11/16
U/S Today:     27w 6d                                        EDD:   11/05/16
Best:          28w 1d    Det. By:   Early Ultrasound         EDD:   11/03/16
(05/05/16)
Anatomy (Fetus C)

Cranium:               Appears normal         Aortic Arch:            Previously seen
Cavum:                 Previously seen        Ductal Arch:            Previously seen
Ventricles:            Appears normal         Diaphragm:              Appears normal
Choroid Plexus:        Previously seen        Stomach:                Appears normal, left
sided
Cerebellum:            Appears normal         Abdomen:                Previously seen
Posterior Fossa:       Previously seen        Abdominal Wall:         Previously seen
Nuchal Fold:           Previously seen        Cord Vessels:           Previously seen
Face:                  Orbits and profile     Kidneys:                Appear normal
previously seen
Lips:                  Previously seen        Bladder:                Appears normal
Thoracic:              Previously seen        Spine:                  Previously seen
Heart:                 Previously seen        Upper Extremities:      Previously seen
RVOT:                  Previously seen        Lower Extremities:      Previously seen
LVOT:                  Previously seen

Other:  Heels and 5th digit previously seen.  Male gender previously seen.
Technically difficult due to fetal position.
Cervix Uterus Adnexa

Cervix
Length:           3.82  cm.
Normal appearance by transabdominal scan.

Uterus
No abnormality visualized.

Left Ovary
Size(cm)     2.91  x    2.58   x  1.47      Vol(ml):
Within normal limits. No adnexal mass visualized.

Right Ovary
Size(cm)        2  x    1.77   x  2.23      Vol(ml):
Within normal limits. No adnexal mass visualized.

Cul De Sac:   No free fluid seen.

Adnexa:       No abnormality visualized.
Impression

Trichorionic/ Triamniotic triplet gestation at 28w 1d

Baby A:
Breech, anterior placenta
Normal interval anatomy
The estimated fetal weight is at the 65th %tile
High normal amniotic fluid volume (MVP 8.8 cm)

Baby B:
Maternal left, cephalic, anterior placenta
Normal interval anatomy
The estimated fetal weight is at the 52nd %tile.
Normal amniotic fluid volume

Baby C:
Maternal right, breech,posterior placenta
Normal interval anatomy
The estimated fetal weight is at the 49th %tile.
Normal amniotic fluid volume

Cervical length 3.8 cm (transabdominal)
Recommendations

Recommend follow up ultrasound in 4 weeks
Antenatal testing beginning at 32 weeks (weekly BPPs)

## 2019-04-17 IMAGING — US US MFM FETAL BPP W/O NON-STRESS
1 series · 12 of 28 positions shown · non-contrast
Comparison: none

[Series 1: us mfm fetal bpp w/o non-stress · 58 acquisitions, 12 frames shown]
[im 3/58]
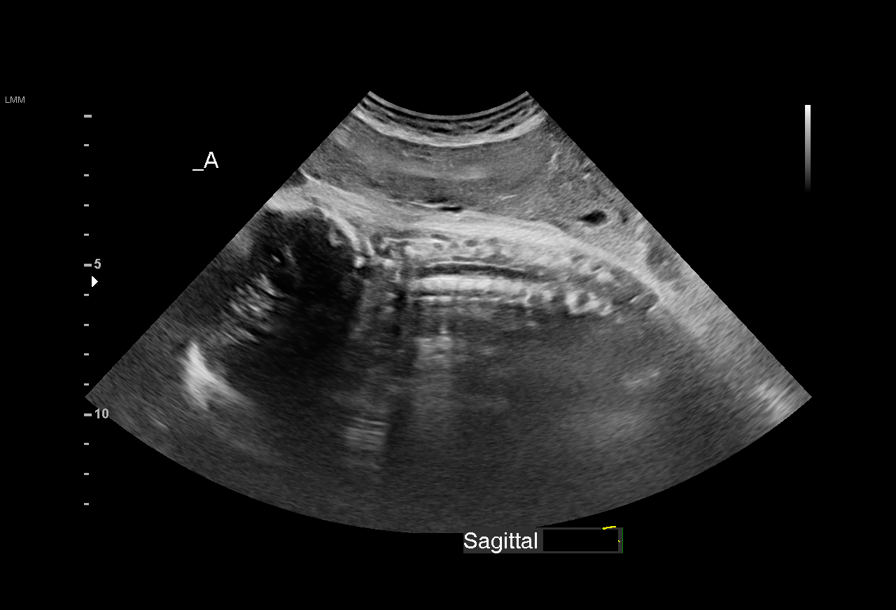
[im 7/58]
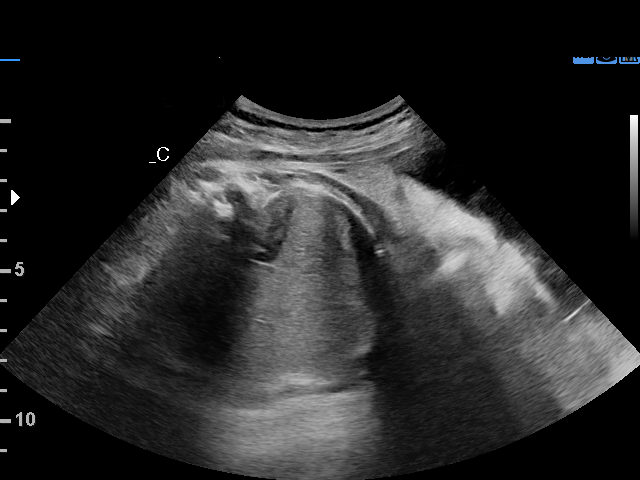
[im 11/58]
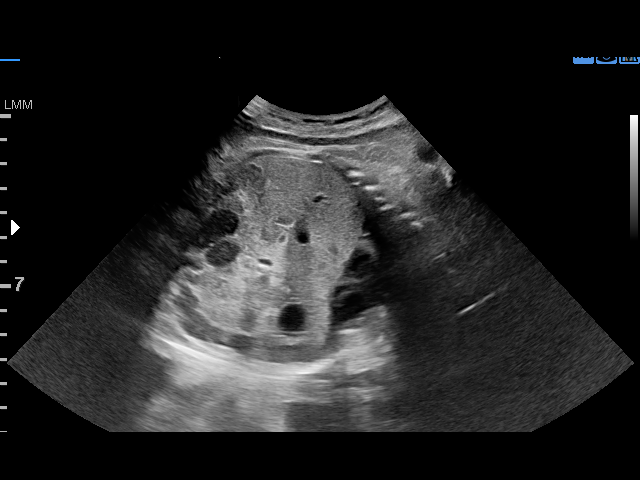
[im 17/58]
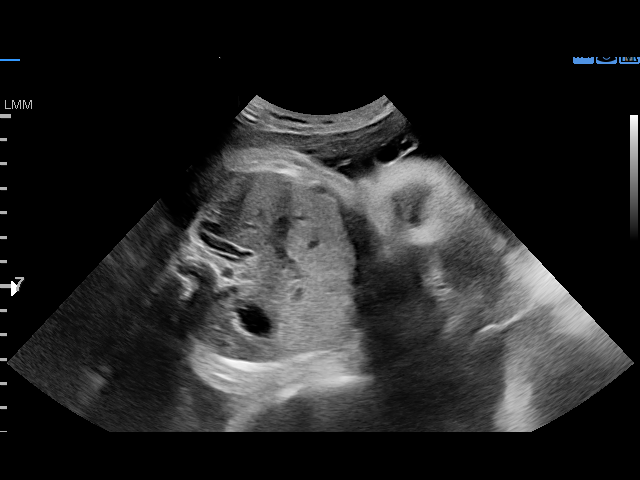
[im 22/58]
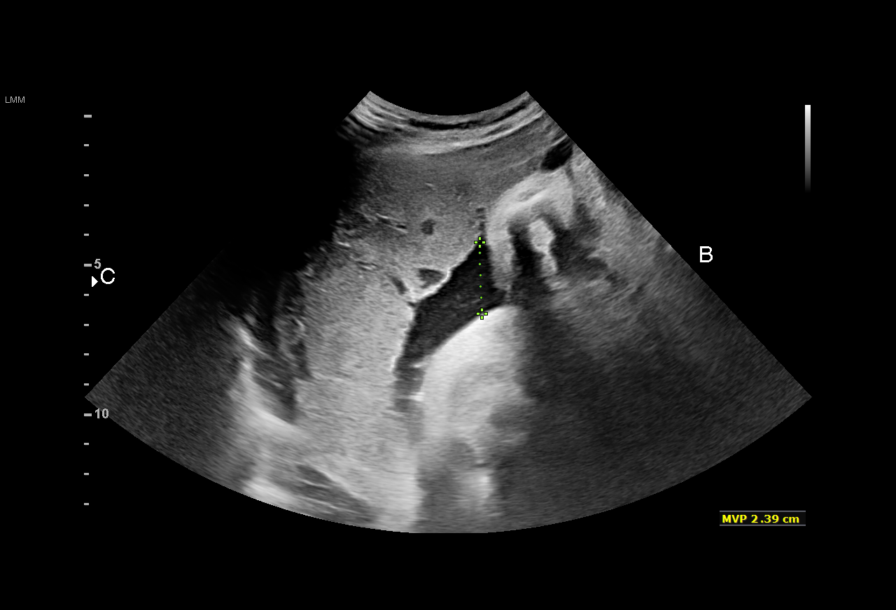
[im 26/58]
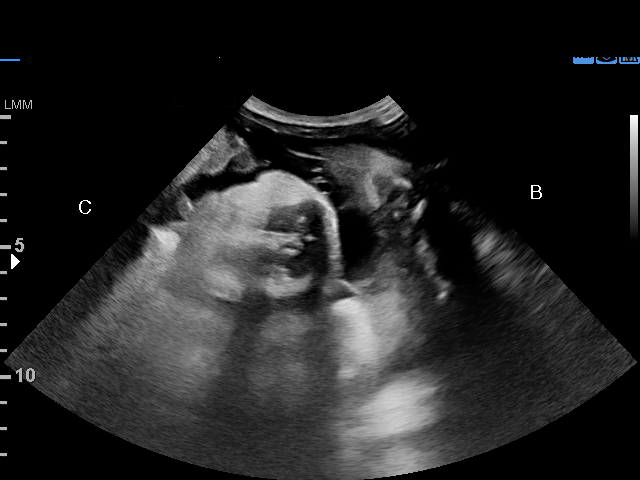
[im 32/58]
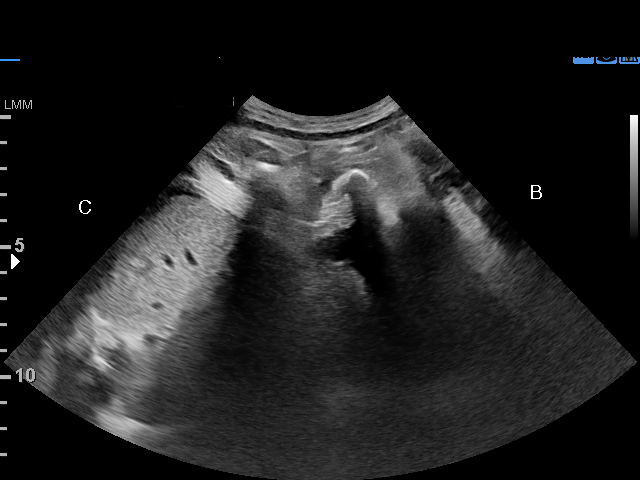
[im 36/58]
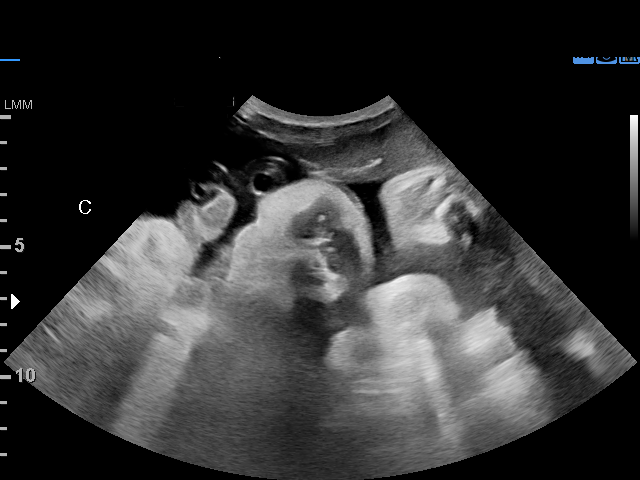
[im 41/58]
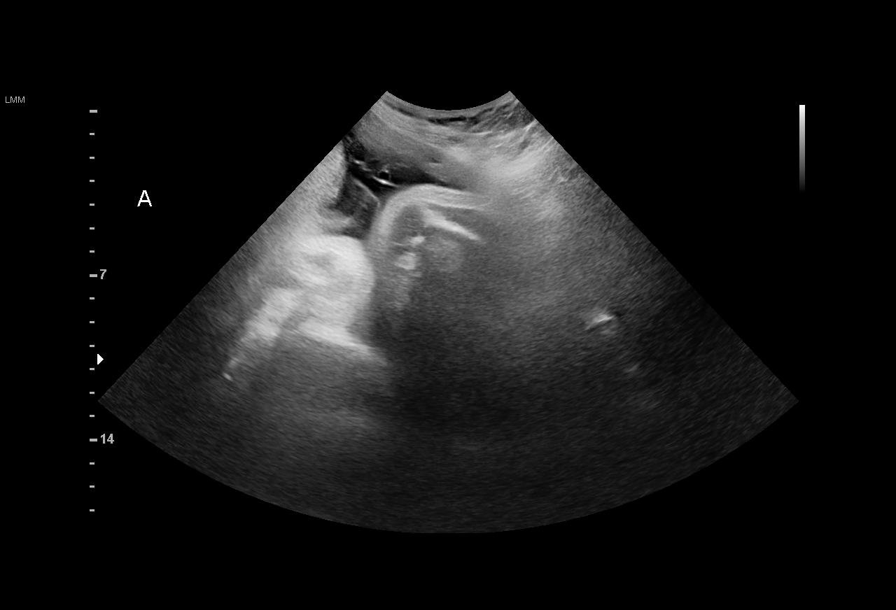
[im 47/58]
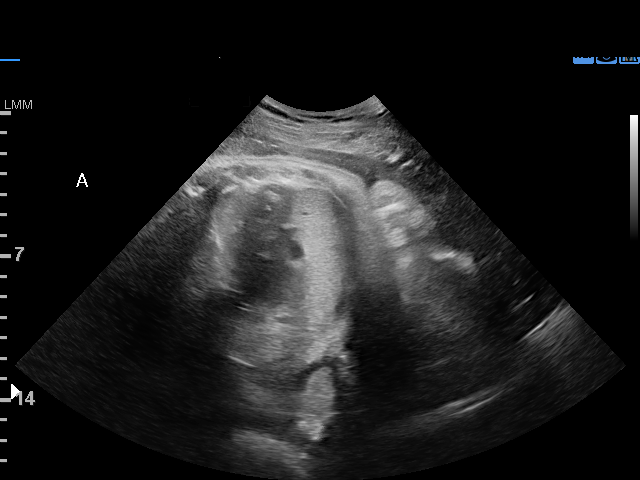
[im 51/58]
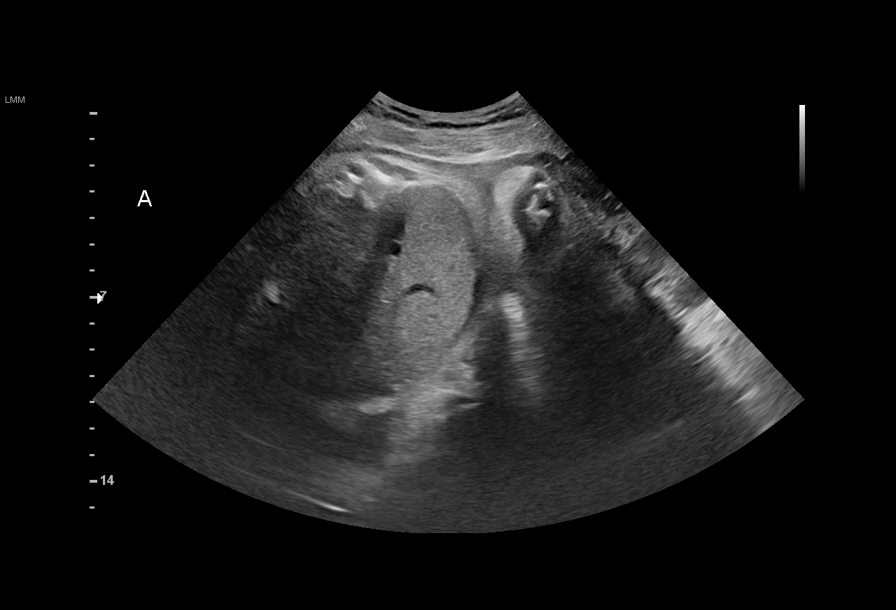
[im 55/58]
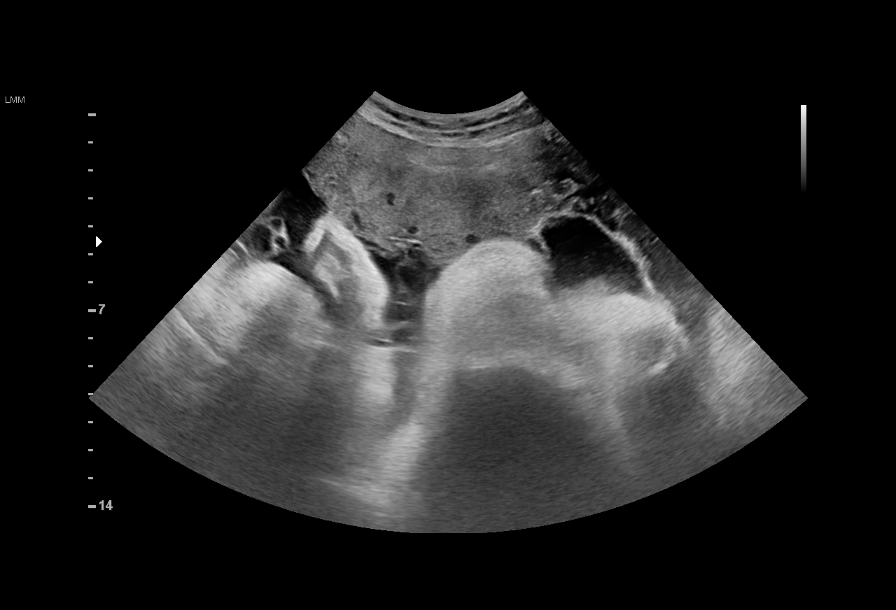

[12 of 28 positions shown; findings below may reference images not displayed]

[REDACTED]

GESTATION
GESTATION

1  KAMBATUKU HEIMO            [id number]      [id number]     [id number]
2  KAMBATUKU HEIMO            [id number]      [id number]     [id number]
3  KAMBATUKU HEIMO            [id number]      [id number]     [id number]
Indications

35 weeks gestation of pregnancy
Triplet gestation
Fetal Evaluation (Fetus A)

Num Of Fetuses:     3
Fetal Heart         140
Rate(bpm):
Cardiac Activity:   Observed
Fetal Lie:          Lower Fetus
Presentation:       Breech, complete
Membrane Desc:      Dividing Membrane seen

Amniotic Fluid
AFI FV:      Subjectively within normal limits

Largest Pocket(cm)
1.9
Biophysical Evaluation (Fetus A)
Amniotic F.V:   Pocket => 2 cm two         F. Tone:        Observed
planes
F. Movement:    Observed                   Score:          [DATE]
F. Breathing:   Observed
Gestational Age (Fetus A)

LMP:           34w 1d       Date:   02/05/16                 EDD:   11/11/16
Best:          35w 2d    Det. By:   Early Ultrasound         EDD:   11/03/16
(05/05/16)

Fetal Evaluation (Fetus B)

Num Of Fetuses:     3
Fetal Heart         131
Rate(bpm):
Cardiac Activity:   Observed
Fetal Lie:          Left Fetus
Presentation:       Cephalic (Oblique w/head presenting)
Membrane Desc:      Dividing Membrane seen

Amniotic Fluid
AFI FV:      Subjectively within normal limits

Largest Pocket(cm)
3.93
Biophysical Evaluation (Fetus B)

Amniotic F.V:   Pocket => 2 cm two         F. Tone:        Observed
planes
F. Movement:    Observed                   Score:          [DATE]
F. Breathing:   Observed
Gestational Age (Fetus B)

LMP:           34w 1d       Date:   02/05/16                 EDD:   11/11/16
Best:          35w 2d    Det. By:   Early Ultrasound         EDD:   11/03/16
(05/05/16)

Fetal Evaluation (Fetus C)

Num Of Fetuses:     3
Fetal Heart         149
Rate(bpm):
Cardiac Activity:   Observed
Fetal Lie:          Right Fetus
Presentation:       Cephalic (oblique w/head presenting)
Membrane Desc:      Dividing Membrane seen

Amniotic Fluid
AFI FV:      Subjectively within normal limits

Largest Pocket(cm)
2.6
Biophysical Evaluation (Fetus C)
Amniotic F.V:   Pocket => 2 cm two         F. Tone:        Observed
planes
F. Movement:    Observed                   Score:          [DATE]
F. Breathing:   Observed
Gestational Age (Fetus C)

LMP:           34w 1d       Date:   02/05/16                 EDD:   11/11/16
Best:          35w 2d    Det. By:   Early Ultrasound         EDD:   11/03/16
(05/05/16)
Impression

IUP at 35+2 weeks with trichorionic triamniotic triplets
Initial BPP on A was [DATE] due to no movement, my evaluation
showed adequate movement for BPP [DATE]
B and C both had [DATE] BPP
All three triplest had normal amniotic fluid
Recommendations

For delivery in 6 days. No further visits in MFM are necessary

## 2019-05-30 ENCOUNTER — Ambulatory Visit (INDEPENDENT_AMBULATORY_CARE_PROVIDER_SITE_OTHER): Payer: BLUE CROSS/BLUE SHIELD | Admitting: *Deleted

## 2019-05-30 ENCOUNTER — Other Ambulatory Visit: Payer: Self-pay

## 2019-05-30 VITALS — BP 112/75 | HR 71

## 2019-05-30 DIAGNOSIS — N760 Acute vaginitis: Secondary | ICD-10-CM | POA: Diagnosis not present

## 2019-05-30 DIAGNOSIS — B9689 Other specified bacterial agents as the cause of diseases classified elsewhere: Secondary | ICD-10-CM | POA: Diagnosis not present

## 2019-05-30 DIAGNOSIS — B373 Candidiasis of vulva and vagina: Secondary | ICD-10-CM

## 2019-05-30 DIAGNOSIS — N898 Other specified noninflammatory disorders of vagina: Secondary | ICD-10-CM

## 2019-05-30 DIAGNOSIS — B3731 Acute candidiasis of vulva and vagina: Secondary | ICD-10-CM

## 2019-05-30 NOTE — Progress Notes (Signed)
SUBJECTIVE:  29 y.o. female complains of copious vaginal discharge on and off, along with vaginal irritation for 2 week(s). Denies abnormal vaginal bleeding or significant pelvic pain or fever. No UTI symptoms. Denies history of known exposure to STD.  No LMP recorded.  OBJECTIVE:  She appears well, afebrile. Urine dipstick: not done.  ASSESSMENT:  Vaginal Discharge  Vaginal Odor   PLAN:   BVAG, CVAG probe sent to lab. Treatment: To be determined once lab results are received ROV prn if symptoms persist or worsen.

## 2019-05-31 NOTE — Progress Notes (Signed)
Attestation of Attending Supervision of clinical support staff: I agree with the care provided to this patient and was available for any consultation.  I have reviewed theRN's note and chart, and I agree with the management and plan.  Reise Gladney Niles Jacaden Forbush, MD, MPH, ABFM Attending Physician Faculty Practice- [facility]  

## 2019-06-01 LAB — CERVICOVAGINAL ANCILLARY ONLY
Bacterial Vaginitis (gardnerella): POSITIVE — AB
Candida Glabrata: NEGATIVE
Candida Vaginitis: POSITIVE — AB
Comment: NEGATIVE
Comment: NEGATIVE
Comment: NEGATIVE

## 2019-06-01 MED ORDER — METRONIDAZOLE 500 MG PO TABS: 500.0000 mg | ORAL_TABLET | Freq: Two times a day (BID) | ORAL | 0 refills | Status: DC

## 2019-06-01 MED ORDER — TERCONAZOLE 0.8 % VA CREA: 1.0000 | TOPICAL_CREAM | Freq: Every day | VAGINAL | 0 refills | Status: DC

## 2019-06-01 NOTE — Addendum Note (Signed)
Addended by: Geanie Berlin on: 06/01/2019 11:44 AM   Modules accepted: Orders

## 2020-03-15 DIAGNOSIS — R0602 Shortness of breath: Secondary | ICD-10-CM | POA: Diagnosis not present

## 2020-03-15 DIAGNOSIS — R5383 Other fatigue: Secondary | ICD-10-CM | POA: Diagnosis not present

## 2020-05-03 NOTE — Progress Notes (Deleted)
New patient visit   Patient: Sabrina Sanchez   DOB: 26-Jan-1991   29 y.o. Female  MRN: [id number] Visit Date: 05/04/2020  Today's healthcare provider: Shirlee Latch, MD   No chief complaint on file.  Subjective    Sabrina Sanchez is a 29 y.o. female who presents today as a new patient to establish care.  HPI  ***  Past Medical History:  Diagnosis Date  . Fracture of wrist AGE 81 AND AGE 89  . Infection    UTI X 1  . Infection    OCC YEAST   Past Surgical History:  Procedure Laterality Date  . CESAREAN SECTION MULTI-GESTATIONAL N/A 10/07/2016   Procedure: CESAREAN SECTION MULTI-GESTATIONAL;  Surgeon: Reva Bores, MD;  Location: [facility] BIRTHING SUITES;  Service: Obstetrics;  Laterality: N/A;  . EYE SURGERY  AGE 46   Family Status  Relation Name Status  . Mat Aunt  (Not Specified)  . MGM  (Not Specified)  . Mother  Alive  . Father  Alive  . Neg Hx  (Not Specified)   Family History  Problem Relation Age of Onset  . Asthma Maternal Aunt   . Hyperlipidemia Maternal Grandmother   . Thrombophlebitis Maternal Grandmother   . Anesthesia problems Neg Hx    Social History   Socioeconomic History  . Marital status: Married    Spouse name: IPEK WESTRA JR  . Number of children: 1  . Years of education: 60  . Highest education level: Not on file  Occupational History  . Occupation: [employer]    Employer: [employer]  Tobacco Use  . Smoking status: Never Smoker  . Smokeless tobacco: Never Used  Substance and Sexual Activity  . Alcohol use: No  . Drug use: No  . Sexual activity: Not Currently    Partners: Male    Birth control/protection: None  Other Topics Concern  . Not on file  Social History Narrative  . Not on file   Social Determinants of Health   Financial Resource Strain: Not on file  Food Insecurity: Not on file  Transportation Needs: Not on file  Physical Activity: Not on file  Stress: Not on file  Social Connections: Not on file    Outpatient Medications Prior to Visit  Medication Sig  . IRON PO Take 1 tablet by mouth daily.  . metroNIDAZOLE (FLAGYL) 500 MG tablet Take 1 tablet (500 mg total) by mouth 2 (two) times daily.  . Prenatal Vit-Fe Fumarate-FA (PRENATAL MULTIVITAMIN) TABS tablet Take 1 tablet by mouth daily.   Marland Kitchen terconazole (TERAZOL 3) 0.8 % vaginal cream Place 1 applicator vaginally at bedtime. Apply nightly for three nights.   No facility-administered medications prior to visit.   No Known Allergies  There is no immunization history for the selected administration types on file for this patient.  Health Maintenance  Topic Date Due  . Hepatitis C Screening  Never done  . TETANUS/TDAP  Never done  . PAP SMEAR-Modifier  08/07/2015  . INFLUENZA VACCINE  Never done  . HIV Screening  Completed    Patient Care Team: Patient, No Pcp Per as PCP - General (General Practice)  Review of Systems  Constitutional: Negative.   HENT: Negative.   Eyes: Negative.   Respiratory: Negative.   Cardiovascular: Negative.   Gastrointestinal: Negative.   Endocrine: Negative.   Genitourinary: Negative.   Musculoskeletal: Negative.   Skin: Negative.   Allergic/Immunologic: Negative.   Neurological: Negative.   Hematological: Negative.   Psychiatric/Behavioral: Negative.     {  Heme  Chem  Endocrine  Serology  Results Review (optional):[id number]::" "}  Objective    There were no vitals taken for this visit. Physical Exam ***  Depression Screen No flowsheet data found. No results found for any visits on 05/04/20.  Assessment & Plan     ***  No follow-ups on file.     {provider attestation***:1}   Shirlee Latch, MD  [facility] [phone] (phone) [phone] (fax)  [facility]

## 2020-05-04 ENCOUNTER — Ambulatory Visit: Payer: Self-pay | Admitting: Family Medicine

## 2020-05-29 ENCOUNTER — Ambulatory Visit: Payer: BLUE CROSS/BLUE SHIELD | Admitting: Obstetrics & Gynecology

## 2020-05-29 ENCOUNTER — Other Ambulatory Visit: Payer: Self-pay

## 2020-05-29 ENCOUNTER — Encounter: Payer: Self-pay | Admitting: Obstetrics & Gynecology

## 2020-05-29 VITALS — BP 125/86 | HR 101 | Ht 67.0 in | Wt 202.4 lb

## 2020-05-29 DIAGNOSIS — L659 Nonscarring hair loss, unspecified: Secondary | ICD-10-CM

## 2020-05-29 LAB — CBC
Hematocrit: 38.1 % (ref 34.0–46.6)
Hemoglobin: 11.7 g/dL (ref 11.1–15.9)
MCH: 22.3 pg — ABNORMAL LOW (ref 26.6–33.0)
MCHC: 30.7 g/dL — ABNORMAL LOW (ref 31.5–35.7)
MCV: 73 fL — ABNORMAL LOW (ref 79–97)
Platelets: 296 10*3/uL (ref 150–450)
RBC: 5.24 x10E6/uL (ref 3.77–5.28)
RDW: 17.3 % — ABNORMAL HIGH (ref 11.7–15.4)
WBC: 6.9 10*3/uL (ref 3.4–10.8)

## 2020-05-29 LAB — HEMOGLOBIN A1C
Est. average glucose Bld gHb Est-mCnc: 117 mg/dL
Hgb A1c MFr Bld: 5.7 % — ABNORMAL HIGH (ref 4.8–5.6)

## 2020-05-29 NOTE — Progress Notes (Signed)
   GYNECOLOGY OFFICE VISIT NOTE  History:   Sabrina Sanchez is a 30 y.o. [phone] here today for evaluation of hair loss.  Has noticed that she is losing her hair and it is thinning out.  No recent infections, history of autoimmune disorders or other issues. No recent stressors.  She denies any current abnormal vaginal discharge, bleeding, pelvic pain or other concerns.    Past Medical History:  Diagnosis Date  . Fracture of wrist AGE 16 AND AGE 40  . Infection    UTI X 1  . Infection    OCC YEAST    Past Surgical History:  Procedure Laterality Date  . CESAREAN SECTION MULTI-GESTATIONAL N/A 10/07/2016   Procedure: CESAREAN SECTION MULTI-GESTATIONAL;  Surgeon: Reva Bores, MD;  Location: [facility] BIRTHING SUITES;  Service: Obstetrics;  Laterality: N/A;  . EYE SURGERY  AGE 19    The following portions of the patient's history were reviewed and updated as appropriate: allergies, current medications, past family history, past medical history, past social history, past surgical history and problem list.   Health Maintenance:  Normal pap at [facility] in 2016.    Review of Systems:  Pertinent items noted in HPI and remainder of comprehensive ROS otherwise negative.  Physical Exam:  BP 125/86   Pulse (!) 101   Ht 5\' 7"  (1.702 m)   Wt 202 lb 6.4 oz (91.8 kg)   LMP 05/10/2020 (Approximate)   BMI 31.70 kg/m  CONSTITUTIONAL: Well-developed, well-nourished female in no acute distress.  HEENT:  Normocephalic, atraumatic. External right and left ear normal. No scleral icterus.  HAIR: AThinned out hair noted, no bald patches noted.  SKIN: No rash noted. Not diaphoretic. No erythema. No pallor. MUSCULOSKELETAL: Normal range of motion. No edema noted. NEUROLOGIC: Alert and oriented to person, place, and time. Normal muscle tone coordination. No cranial nerve deficit noted. PSYCHIATRIC: Normal mood and affect. Normal behavior. Normal judgment and thought content. CARDIOVASCULAR: Normal heart rate  noted RESPIRATORY: Effort and breath sounds normal, no problems with respiration noted ABDOMEN: No masses noted. No other overt distention noted.   PELVIC: Deferred     Assessment and Plan:    1. Hair loss disorder Patient understands that only laboratory evaluation will be done, further evaluaiton and management will be done by dermatologist or other specialists. Will follow up results and manage accordingly. - Testosterone,Free and Total - TSH - DHEA-sulfate - RPR - Ferritin - Iron - CBC Routine preventative health maintenance measures emphasized, needs pap smear soon. Please refer to After Visit Summary for other counseling recommendations.   Return for Annual and pap smear soon.    I spent 15 minutes dedicated to the care of this patient including pre-visit review of records, face to face time with the patient discussing her conditions and treatments and post visit ordering of testing.    05/12/2020, MD, FACOG Obstetrician & Gynecologist, [facility] for [facility]

## 2020-05-30 LAB — TSH: TSH: 1.27 u[IU]/mL (ref 0.450–4.500)

## 2020-05-30 LAB — TESTOSTERONE,FREE AND TOTAL
Testosterone, Free: 1 pg/mL (ref 0.0–4.2)
Testosterone: 14 ng/dL (ref 8–60)

## 2020-05-30 LAB — IRON: Iron: 30 ug/dL (ref 27–159)

## 2020-05-30 LAB — DHEA-SULFATE: DHEA-SO4: 177 ug/dL (ref 84.8–378.0)

## 2020-05-30 LAB — RPR: RPR Ser Ql: NONREACTIVE

## 2020-05-30 LAB — FERRITIN: Ferritin: 19 ng/mL (ref 15–150)

## 2020-06-12 DIAGNOSIS — L650 Telogen effluvium: Secondary | ICD-10-CM | POA: Diagnosis not present

## 2020-06-26 ENCOUNTER — Ambulatory Visit (INDEPENDENT_AMBULATORY_CARE_PROVIDER_SITE_OTHER): Payer: BLUE CROSS/BLUE SHIELD | Admitting: Obstetrics & Gynecology

## 2020-06-26 ENCOUNTER — Encounter: Payer: Self-pay | Admitting: Obstetrics & Gynecology

## 2020-06-26 ENCOUNTER — Other Ambulatory Visit (HOSPITAL_COMMUNITY)
Admission: RE | Admit: 2020-06-26 | Discharge: 2020-06-26 | Disposition: A | Discharge status: Home / Self Care | Payer: BLUE CROSS/BLUE SHIELD | Source: Ambulatory Visit | Attending: Obstetrics & Gynecology | Admitting: Obstetrics & Gynecology

## 2020-06-26 ENCOUNTER — Other Ambulatory Visit: Payer: Self-pay

## 2020-06-26 VITALS — BP 108/75 | HR 83 | Ht 67.0 in | Wt 203.8 lb

## 2020-06-26 DIAGNOSIS — Z01419 Encounter for gynecological examination (general) (routine) without abnormal findings: Secondary | ICD-10-CM | POA: Insufficient documentation

## 2020-06-26 NOTE — Progress Notes (Signed)
GYNECOLOGY ANNUAL PREVENTATIVE CARE ENCOUNTER NOTE  History:     Sabrina Sanchez is a 30 y.o. [phone] female here for a routine annual gynecologic exam.  Current complaints: none.   Denies abnormal vaginal bleeding, discharge, pelvic pain, problems with intercourse or other gynecologic concerns.    Gynecologic History No LMP recorded. (Menstrual status: Irregular Periods). Contraception: condoms Last Pap: 2016. Results were: normal .   Obstetric History OB History  Gravida Para Term Preterm AB Living  4 4 3 1  0 6  SAB IAB Ectopic Multiple Live Births  0 0 0 1 6    # Outcome Date GA Lbr Len/2nd Weight Sex Delivery Anes PTL Lv  4A Preterm 10/07/16 [redacted]w[redacted]d  4 lb 15.9 oz (2.265 kg) F CS-LTranv Spinal  LIV  4B Preterm 10/07/16 [redacted]w[redacted]d  5 lb 8.7 oz (2.515 kg) F CS-LTranv Spinal  LIV     Birth Comments: normal term female  4C Preterm 10/07/16 [redacted]w[redacted]d  5 lb 13.3 oz (2.645 kg) M CS-LTranv Spinal  LIV  3 Term 11/12/14 [redacted]w[redacted]d 28:45 / 00:09 8 lb 4.5 oz (3.756 kg) M Vag-Spont Local  LIV  2 Term 03/02/13 [redacted]w[redacted]d 05:50 / 00:13 7 lb 13.6 oz (3.561 kg) F Vag-Spont Local  LIV  1 Term 04/29/11 106w6d 11:45 / 00:34 7 lb 14.6 oz (3.589 kg) F Vag-Spont None  LIV     Birth Comments: MECONIUM    Past Medical History:  Diagnosis Date  . Fracture of wrist AGE 89 AND AGE 4  . Infection    UTI X 1  . Infection    OCC YEAST    Past Surgical History:  Procedure Laterality Date  . CESAREAN SECTION MULTI-GESTATIONAL N/A 10/07/2016   Procedure: CESAREAN SECTION MULTI-GESTATIONAL;  Surgeon: 10/09/2016, MD;  Location: [facility] BIRTHING SUITES;  Service: Obstetrics;  Laterality: N/A;  . EYE SURGERY  AGE 30    Current Outpatient Medications on File Prior to Visit  Medication Sig Dispense Refill  . IRON PO Take 1 tablet by mouth daily.    . Multiple Vitamin (MULTIVITAMIN WITH MINERALS) TABS tablet Take 1 tablet by mouth daily.     No current facility-administered medications on file prior to visit.    No Known  Allergies  Social History:  reports that she has never smoked. She has never used smokeless tobacco. She reports that she does not drink alcohol and does not use drugs.  Family History  Problem Relation Age of Onset  . Asthma Maternal Aunt   . Hyperlipidemia Maternal Grandmother   . Thrombophlebitis Maternal Grandmother   . Anesthesia problems Neg Hx     The following portions of the patient's history were reviewed and updated as appropriate: allergies, current medications, past family history, past medical history, past social history, past surgical history and problem list.  Review of Systems Pertinent items noted in HPI and remainder of comprehensive ROS otherwise negative.  Physical Exam:  BP 108/75   Pulse 83   Ht 5\' 7"  (1.702 m)   Wt 203 lb 12.8 oz (92.4 kg)   BMI 31.92 kg/m  CONSTITUTIONAL: Well-developed, well-nourished female in no acute distress.  HENT:  Normocephalic, atraumatic, External right and left ear normal.  EYES: Conjunctivae and EOM are normal. Pupils are equal, round, and reactive to light. No scleral icterus.  NECK: Normal range of motion, supple, no masses.  Normal thyroid.  SKIN: Skin is warm and dry. No rash noted. Not diaphoretic. No erythema. No pallor. MUSCULOSKELETAL: Normal  range of motion. No tenderness.  No cyanosis, clubbing, or edema.   NEUROLOGIC: Alert and oriented to person, place, and time. Normal reflexes, muscle tone coordination.  PSYCHIATRIC: Normal mood and affect. Normal behavior. Normal judgment and thought content. CARDIOVASCULAR: Normal heart rate noted, regular rhythm RESPIRATORY: Clear to auscultation bilaterally. Effort and breath sounds normal, no problems with respiration noted. BREASTS: Symmetric in size. No masses, tenderness, skin changes, nipple drainage, or lymphadenopathy bilaterally. Performed in the presence of a chaperone. ABDOMEN: Soft, no distention noted.  No tenderness, rebound or guarding.  PELVIC: Normal appearing  external genitalia and urethral meatus; normal appearing vaginal mucosa and cervix.  No abnormal discharge noted.  Pap smear obtained.  Normal uterine size, no other palpable masses, no uterine or adnexal tenderness.  Performed in the presence of a chaperone.   Assessment and Plan:    1. Well woman exam with routine gynecological exam - Cytology - PAP Will follow up results of pap smear and manage accordingly. Routine preventative health maintenance measures emphasized. Please refer to After Visit Summary for other counseling recommendations.      Jaynie Collins, MD, FACOG Obstetrician & Gynecologist, [facility] for [employer], [facility]

## 2020-06-26 NOTE — Patient Instructions (Signed)
Preventive Care 21-32 Years Old, Female Preventive care refers to lifestyle choices and visits with your health care provider that can promote health and wellness. This includes:  A yearly physical exam. This is also called an annual wellness visit.  Regular dental and eye exams.  Immunizations.  Screening for certain conditions.  Healthy lifestyle choices, such as: ? Eating a healthy diet. ? Getting regular exercise. ? Not using drugs or products that contain nicotine and tobacco. ? Limiting alcohol use. What can I expect for my preventive care visit? Physical exam Your health care provider may check your:  Height and weight. These may be used to calculate your BMI (body mass index). BMI is a measurement that tells if you are at a healthy weight.  Heart rate and blood pressure.  Body temperature.  Skin for abnormal spots. Counseling Your health care provider may ask you questions about your:  Past medical problems.  Family's medical history.  Alcohol, tobacco, and drug use.  Emotional well-being.  Home life and relationship well-being.  Sexual activity.  Diet, exercise, and sleep habits.  Work and work environment.  Access to firearms.  Method of birth control.  Menstrual cycle.  Pregnancy history. What immunizations do I need? Vaccines are usually given at various ages, according to a schedule. Your health care provider will recommend vaccines for you based on your age, medical history, and lifestyle or other factors, such as travel or where you work.   What tests do I need? Blood tests  Lipid and cholesterol levels. These may be checked every 5 years starting at age 20.  Hepatitis C test.  Hepatitis B test. Screening  Diabetes screening. This is done by checking your blood sugar (glucose) after you have not eaten for a while (fasting).  STD (sexually transmitted disease) testing, if you are at risk.  BRCA-related cancer screening. This may be  done if you have a family history of breast, ovarian, tubal, or peritoneal cancers.  Pelvic exam and Pap test. This may be done every 3 years starting at age 21. Starting at age 30, this may be done every 5 years if you have a Pap test in combination with an HPV test. Talk with your health care provider about your test results, treatment options, and if necessary, the need for more tests.   Follow these instructions at home: Eating and drinking  Eat a healthy diet that includes fresh fruits and vegetables, whole grains, lean protein, and low-fat dairy products.  Take vitamin and mineral supplements as recommended by your health care provider.  Do not drink alcohol if: ? Your health care provider tells you not to drink. ? You are pregnant, may be pregnant, or are planning to become pregnant.  If you drink alcohol: ? Limit how much you have to 0-1 drink a day. ? Be aware of how much alcohol is in your drink. In the [place] one drink equals one 12 oz bottle of beer (355 mL), one 5 oz glass of wine (148 mL), or one 1 oz glass of hard liquor (44 mL).   Lifestyle  Take daily care of your teeth and gums. Brush your teeth every morning and night with fluoride toothpaste. Floss one time each day.  Stay active. Exercise for at least 30 minutes 5 or more days each week.  Do not use any products that contain nicotine or tobacco, such as cigarettes, e-cigarettes, and chewing tobacco. If you need help quitting, ask your health care provider.  Do not   use drugs.  If you are sexually active, practice safe sex. Use a condom or other form of protection to prevent STIs (sexually transmitted infections).  If you do not wish to become pregnant, use a form of birth control. If you plan to become pregnant, see your health care provider for a prepregnancy visit.  Find healthy ways to cope with stress, such as: ? Meditation, yoga, or listening to music. ? Journaling. ? Talking to a trusted  person. ? Spending time with friends and family. Safety  Always wear your seat belt while driving or riding in a vehicle.  Do not drive: ? If you have been drinking alcohol. Do not ride with someone who has been drinking. ? When you are tired or distracted. ? While texting.  Wear a helmet and other protective equipment during sports activities.  If you have firearms in your house, make sure you follow all gun safety procedures.  Seek help if you have been physically or sexually abused. What's next?  Go to your health care provider once a year for an annual wellness visit.  Ask your health care provider how often you should have your eyes and teeth checked.  Stay up to date on all vaccines. This information is not intended to replace advice given to you by your health care provider. Make sure you discuss any questions you have with your health care provider. Document Revised: 01/01/2020 Document Reviewed: 01/14/2018 Elsevier Patient Education  2021 Elsevier Inc.  

## 2020-06-28 LAB — CYTOLOGY - PAP
Comment: NEGATIVE
Diagnosis: NEGATIVE
High risk HPV: NEGATIVE

## 2020-09-11 DIAGNOSIS — H5034 Intermittent alternating exotropia: Secondary | ICD-10-CM | POA: Diagnosis not present

## 2020-09-11 DIAGNOSIS — Z9889 Other specified postprocedural states: Secondary | ICD-10-CM | POA: Diagnosis not present

## 2021-04-01 IMAGING — US US PELVIS COMPLETE WITH TRANSVAGINAL
1 series · 15 of 25 positions shown · non-contrast
Comparison: No prior non obstetric pelvic sonogram.

CLINICAL DATA: 28-year-old female with abnormal uterine bleeding
with chronic prolonged painful menses. LMP 09/09/2018.

EXAM:
TRANSABDOMINAL AND TRANSVAGINAL ULTRASOUND OF PELVIS
TECHNIQUE: Both transabdominal and transvaginal ultrasound examinations of the
pelvis were performed. Transabdominal technique was performed for
global imaging of the pelvis including uterus, ovaries, adnexal
regions, and pelvic cul-de-sac. It was necessary to proceed with
endovaginal exam following the transabdominal exam to visualize the
endometrium, myometrium and adnexa.

[Series 1: us pelvis complete with transvaginal · 15 of 74 slices shown]
[im 1/74]
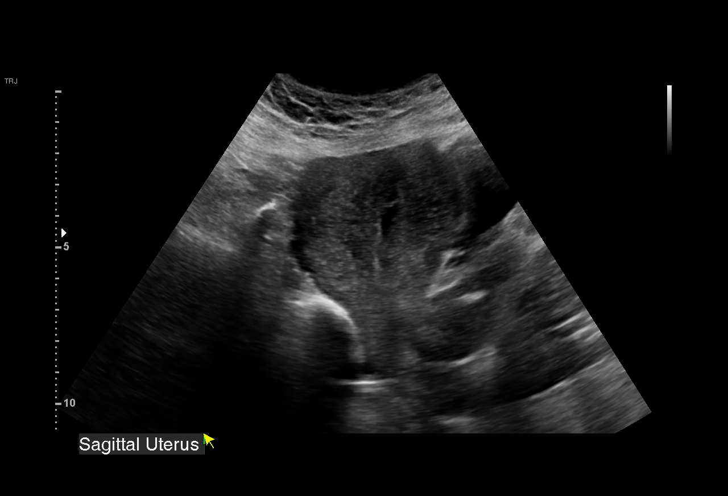
[im 7/74]
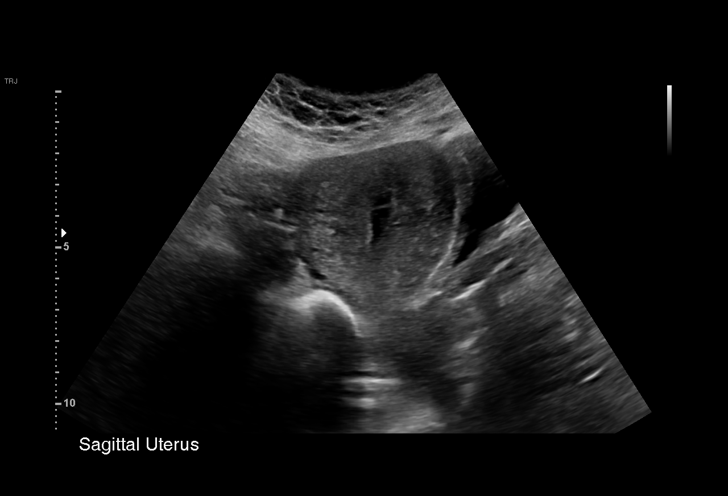
[im 13/74]
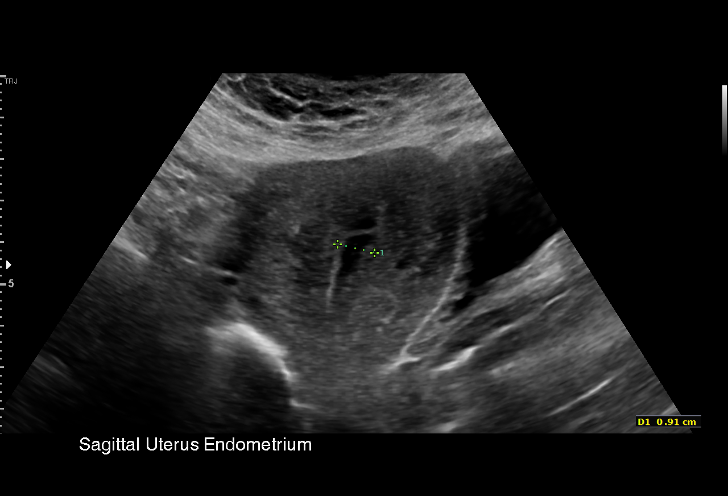
[im 16/74]
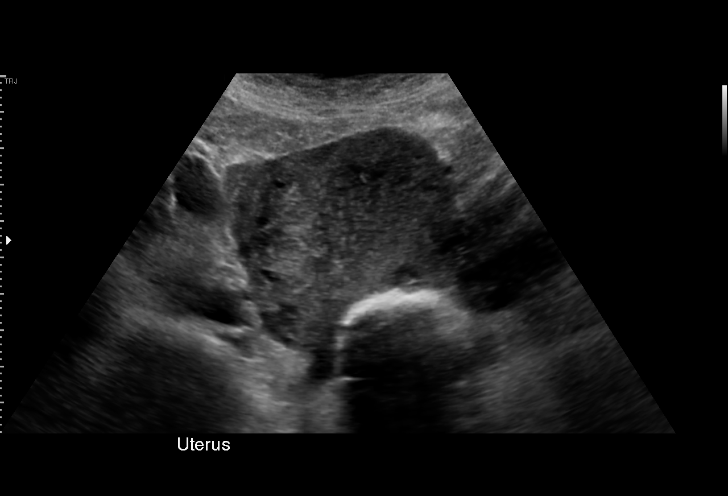
[im 22/74]
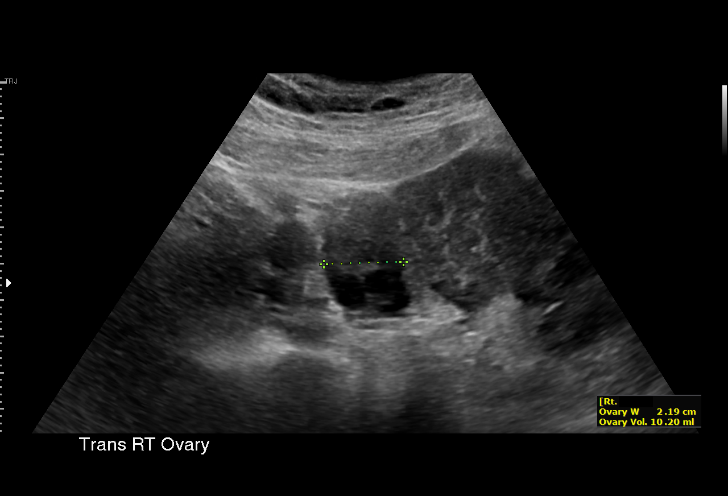
[im 28/74]
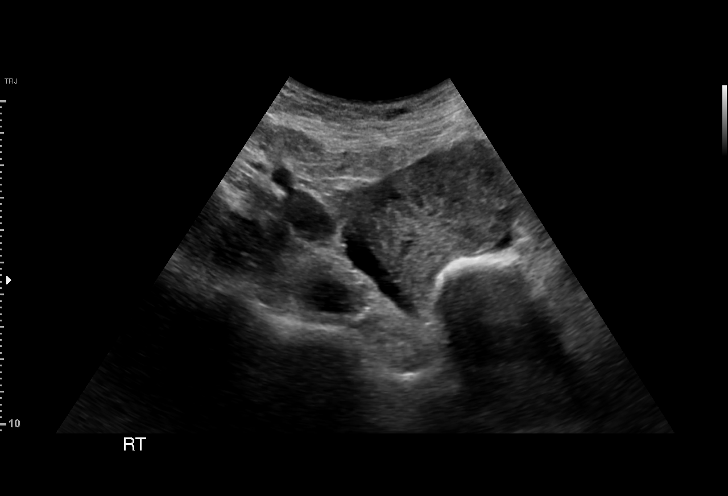
[im 31/74]
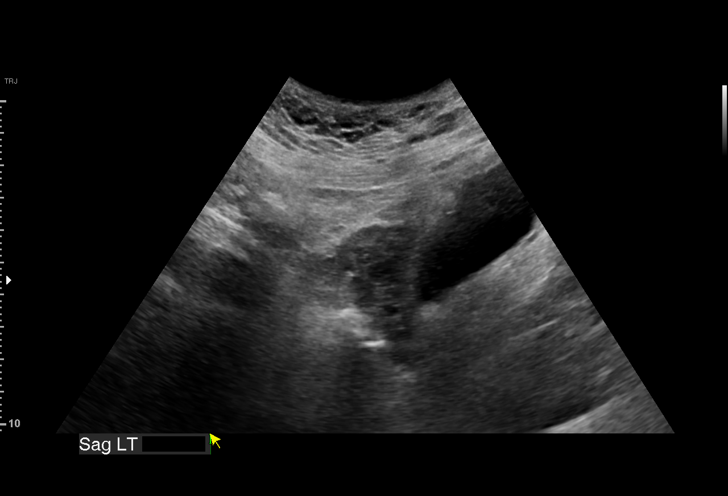
[im 37/74]
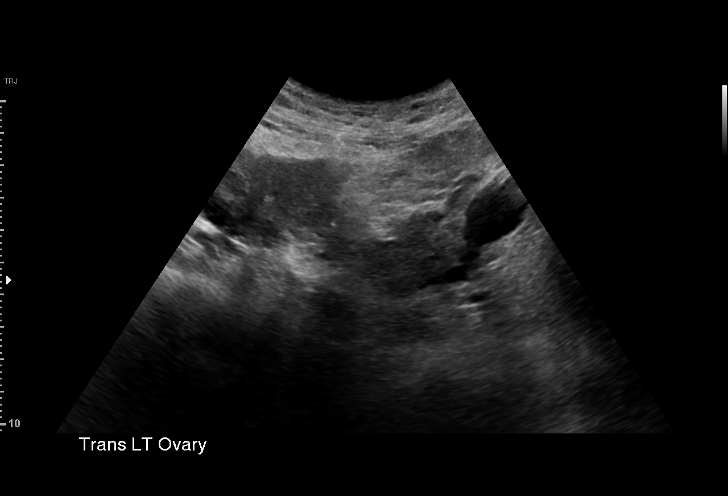
[im 43/74]
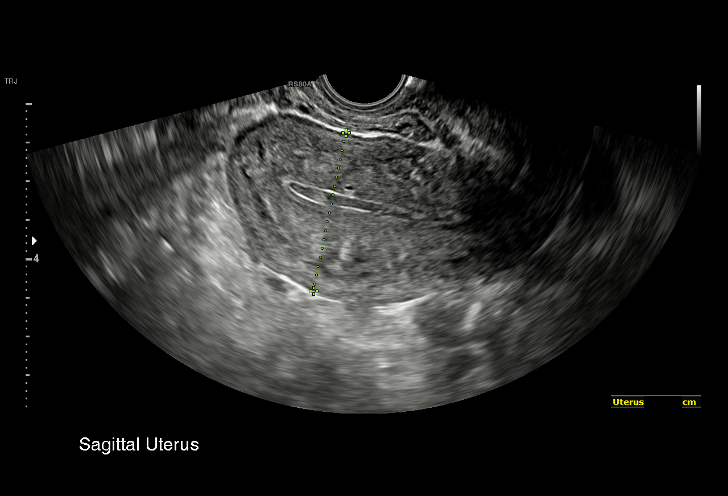
[im 46/74]
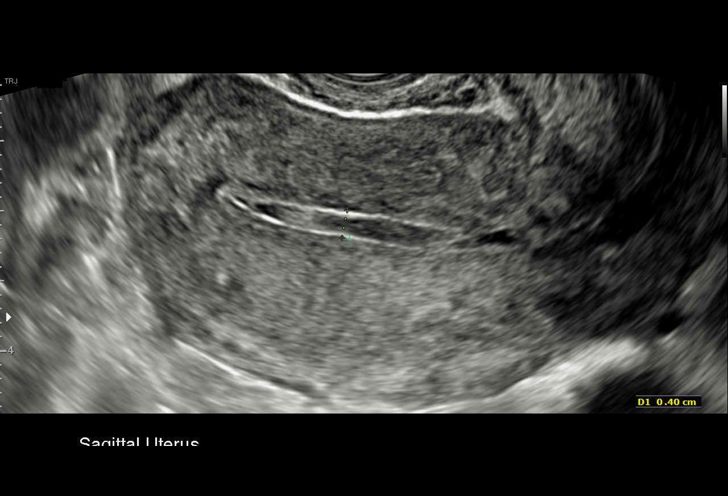
[im 52/74]
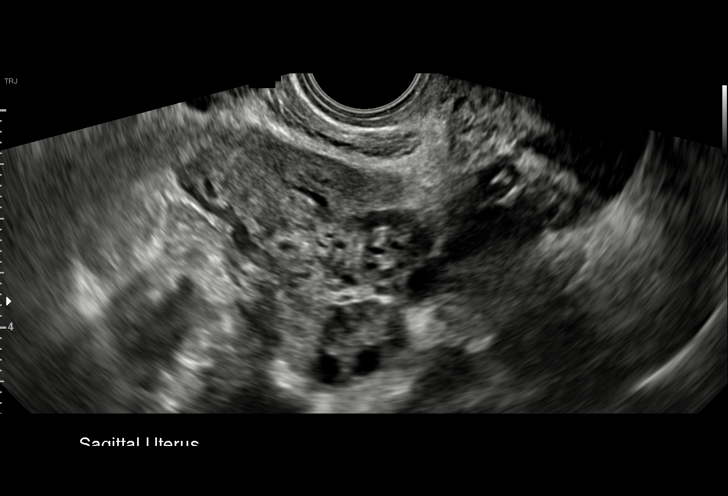
[im 58/74]
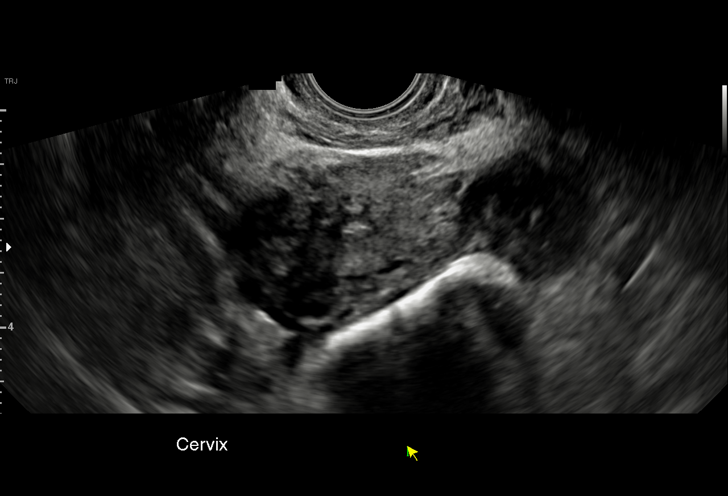
[im 61/74]
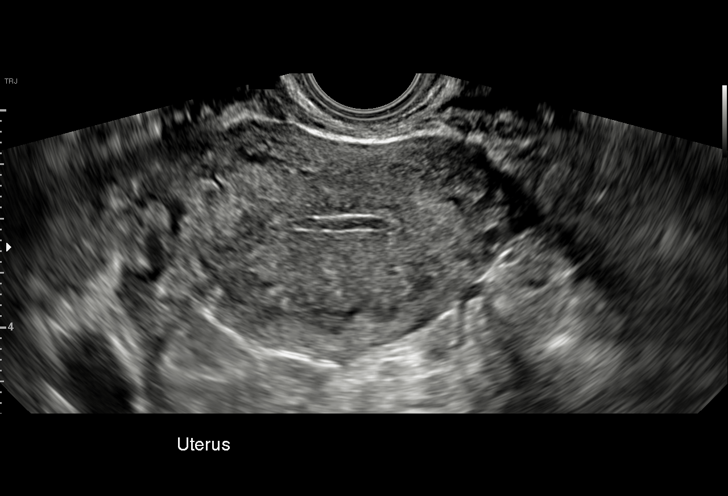
[im 67/74]
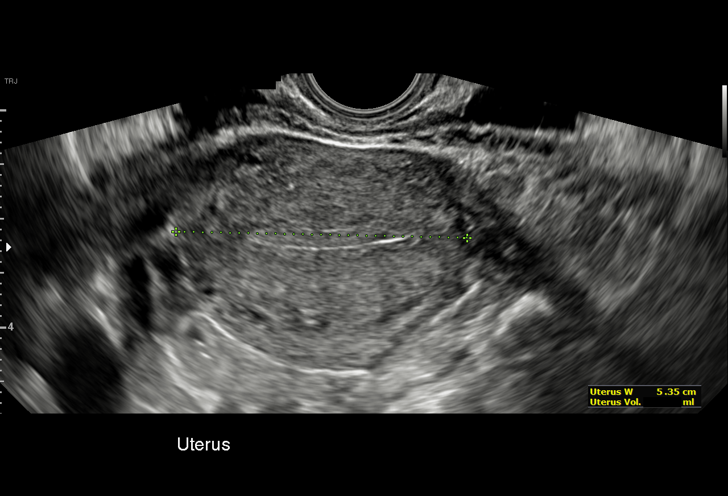
[im 74/74]
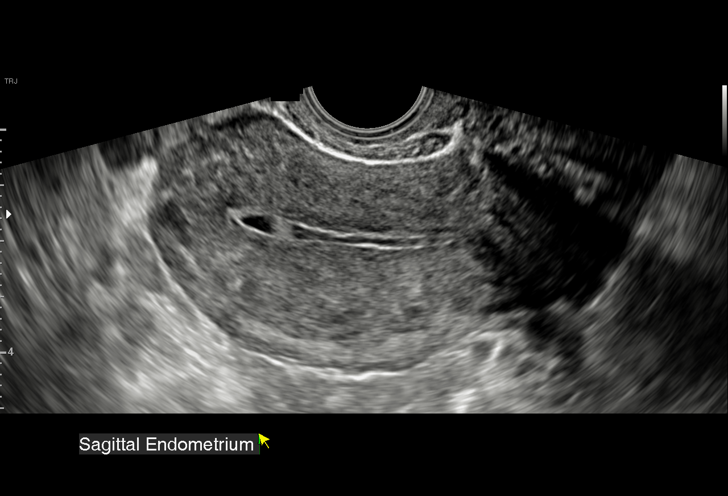

[15 of 25 positions shown; findings below may reference images not displayed]

FINDINGS: Uterus

Measurements: 8.7 x 5.0 x 5.9 cm = volume: 135 mL. Mildly enlarged
anteverted uterus, with no uterine fibroids or other myometrial
abnormalities.

Endometrium

Thickness: 2 mm. Small amount of mobile fluid within the endometrial
cavity with heterogeneous internal echoes. No focal endometrial mass
demonstrated.

Right ovary

Measurements: 4.2 x 2.4 x 2.5 cm = volume: 12.9 mL. Normal
appearance/no adnexal mass.

Left ovary

Measurements: 3.7 x 2.1 x 2.2 cm = volume: 8.7 mL. Normal
appearance/no adnexal mass.

Other findings

No abnormal free fluid.
IMPRESSION: 1. No uterine fibroids.
2. Thin (2 mm) endometrium. Small amount of mobile complex fluid
within the uterine cavity. No focal endometrial mass. If bleeding
remains unresponsive to hormonal or medical therapy, sonohysterogram
may be considered for focal lesion work-up. (Ref: Radiological
Reasoning: Algorithmic Workup of Abnormal Vaginal Bleeding with
Endovaginal Sonography and Sonohysterography. AJR [id number]; 191:S68-73)
3. Normal ovaries.  No adnexal masses.

## 2021-04-30 ENCOUNTER — Other Ambulatory Visit: Payer: Self-pay

## 2021-04-30 ENCOUNTER — Ambulatory Visit
Admission: EM | Admit: 2021-04-30 | Discharge: 2021-04-30 | Disposition: A | Discharge status: Home / Self Care | Payer: BLUE CROSS/BLUE SHIELD | Attending: Emergency Medicine | Admitting: Emergency Medicine

## 2021-04-30 ENCOUNTER — Encounter: Payer: Self-pay | Admitting: Emergency Medicine

## 2021-04-30 DIAGNOSIS — M62838 Other muscle spasm: Secondary | ICD-10-CM | POA: Diagnosis not present

## 2021-04-30 DIAGNOSIS — M436 Torticollis: Secondary | ICD-10-CM

## 2021-04-30 MED ORDER — IBUPROFEN 600 MG PO TABS: 600.0000 mg | ORAL_TABLET | Freq: Four times a day (QID) | ORAL | 0 refills | Status: AC | PRN

## 2021-04-30 MED ORDER — METHOCARBAMOL 500 MG PO TABS: 500.0000 mg | ORAL_TABLET | Freq: Two times a day (BID) | ORAL | 0 refills | Status: AC | PRN

## 2021-04-30 NOTE — ED Provider Notes (Signed)
[facility]    CSN: [id number] Arrival date & time: 04/30/21  0930      History   Chief Complaint Chief Complaint  Patient presents with   Torticollis    HPI Sabrina Sanchez is a 30 y.o. female.  Patient presents with right side neck and trapezius pain and stiffness since waking up this morning.  No falls or injury.  No rash, bruising, redness, fever, ear pain, sore throat, cough, shortness of breath, or other symptoms.  No treatments at home.  LMP: 2 weeks ago.  Patient denies pregnancy or breast-feeding.  The history is provided by the patient and medical records.   Past Medical History:  Diagnosis Date   Fracture of wrist AGE 61 AND AGE 89   Infection    UTI X 1   Infection    OCC YEAST    Patient Active Problem List   Diagnosis Date Noted   Abnormal uterine bleeding 08/12/2018   Fatigue due to exposure 08/12/2018   Left inguinal hernia 06/24/2016    Past Surgical History:  Procedure Laterality Date   CESAREAN SECTION MULTI-GESTATIONAL N/A 10/07/2016   Procedure: CESAREAN SECTION MULTI-GESTATIONAL;  Surgeon: Reva Bores, MD;  Location: [facility];  Service: Obstetrics;  Laterality: N/A;   EYE SURGERY  AGE 14    OB History     Gravida  4   Para  4   Term  3   Preterm  1   AB  0   Living  6      SAB  0   IAB  0   Ectopic  0   Multiple  1   Live Births  6            Home Medications    Prior to Admission medications   Medication Sig Start Date End Date Taking? Authorizing Provider  ibuprofen (ADVIL) 600 MG tablet Take 1 tablet (600 mg total) by mouth every 6 (six) hours as needed. 04/30/21  Yes Mickie Bail, NP  methocarbamol (ROBAXIN) 500 MG tablet Take 1 tablet (500 mg total) by mouth 2 (two) times daily as needed for muscle spasms. 04/30/21  Yes Mickie Bail, NP  IRON PO Take 1 tablet by mouth daily.    [provider]  Multiple Vitamin (MULTIVITAMIN WITH MINERALS) TABS tablet Take 1 tablet by mouth daily.     [provider]    Family History Family History  Problem Relation Age of Onset   Asthma Maternal Aunt    Hyperlipidemia Maternal Grandmother    Thrombophlebitis Maternal Grandmother    Anesthesia problems Neg Hx     Social History Social History   Tobacco Use   Smoking status: Never   Smokeless tobacco: Never  Substance Use Topics   Alcohol use: No   Drug use: No     Allergies   Patient has no known allergies.   Review of Systems Review of Systems  Constitutional:  Negative for chills and fever.  HENT:  Negative for ear pain and sore throat.   Respiratory:  Negative for cough and shortness of breath.   Cardiovascular:  Negative for chest pain and palpitations.  Musculoskeletal:  Positive for back pain, myalgias and neck pain.  Skin:  Negative for color change, rash and wound.  Neurological:  Negative for weakness and numbness.  All other systems reviewed and are negative.   Physical Exam Triage Vital Signs ED Triage Vitals  Enc Vitals Group  BP 04/30/21 0944 119/77     Pulse Rate 04/30/21 0944 85     Resp 04/30/21 0944 18     Temp 04/30/21 0944 98.1 F (36.7 C)     Temp src --      SpO2 04/30/21 0944 98 %     Weight --      Height --      Head Circumference --      Peak Flow --      Pain Score 04/30/21 0954 8     Pain Loc --      Pain Edu? --      Excl. in GC? --    No data found.  Updated Vital Signs BP 119/77 (BP Location: Left Arm)    Pulse 85    Temp 98.1 F (36.7 C)    Resp 18    SpO2 98%   Visual Acuity Right Eye Distance:   Left Eye Distance:   Bilateral Distance:    Right Eye Near:   Left Eye Near:    Bilateral Near:     Physical Exam Vitals and nursing note reviewed.  Constitutional:      General: She is not in acute distress.    Appearance: She is well-developed. She is not ill-appearing.  HENT:     Mouth/Throat:     Mouth: Mucous membranes are moist.     Pharynx: Oropharynx is clear.  Neck:     Comments:  Right side neck and upper back slightly tender to palpation of muscles. Decreased ROM due to pain.  Cardiovascular:     Rate and Rhythm: Normal rate and regular rhythm.     Heart sounds: Normal heart sounds.  Pulmonary:     Effort: Pulmonary effort is normal. No respiratory distress.     Breath sounds: Normal breath sounds.  Musculoskeletal:        General: Tenderness present. No swelling or deformity.       Arms:     Cervical back: Rigidity and tenderness present.  Skin:    General: Skin is warm and dry.     Capillary Refill: Capillary refill takes less than 2 seconds.     Findings: No bruising, erythema, lesion or rash.  Neurological:     General: No focal deficit present.     Mental Status: She is alert and oriented to person, place, and time.     Sensory: No sensory deficit.     Motor: No weakness.     Gait: Gait normal.     Comments: Upper extremities: 2+ pulses, sensation intact, FROM.  Psychiatric:        Mood and Affect: Mood normal.        Behavior: Behavior normal.     UC Treatments / Results  Labs (all labs ordered are listed, but only abnormal results are displayed) Labs Reviewed - No data to display  EKG   Radiology No results found.  Procedures Procedures (including critical care time)  Medications Ordered in UC Medications - No data to display  Initial Impression / Assessment and Plan / UC Course  I have reviewed the triage vital signs and the nursing notes.  Pertinent labs & imaging results that were available during my care of the patient were reviewed by me and considered in my medical decision making (see chart for details).  Torticollis, muscle spasm.  Treating with ibuprofen and methocarbamol.  Precautions for drowsiness with methocarbamol discussed.  Education provided on torticollis and muscle spasms.  Instructed  patient to follow-up with her PCP or an orthopedist if her symptoms are not improving.  She agrees to plan of care.  Final  Clinical Impressions(s) / UC Diagnoses   Final diagnoses:  Torticollis  Muscle spasm     Discharge Instructions      Take ibuprofen as needed for discomfort.  Take the muscle relaxer as needed for muscle spasm; Do not drive, operate machinery, or drink alcohol with this medication as it can cause drowsiness.   Follow up with your primary care provider or an orthopedist if your symptoms are not improving.         ED Prescriptions     Medication Sig Dispense Auth. Provider   ibuprofen (ADVIL) 600 MG tablet Take 1 tablet (600 mg total) by mouth every 6 (six) hours as needed. 30 tablet Mickie Bail, NP   methocarbamol (ROBAXIN) 500 MG tablet Take 1 tablet (500 mg total) by mouth 2 (two) times daily as needed for muscle spasms. 10 tablet Mickie Bail, NP      I have reviewed the PDMP during this encounter.   Mickie Bail, NP 04/30/21 1030

## 2021-04-30 NOTE — ED Triage Notes (Signed)
Pt here with painful, stiff neck since 5 am this morning.

## 2021-04-30 NOTE — Discharge Instructions (Addendum)
Take ibuprofen as needed for discomfort.  Take the muscle relaxer as needed for muscle spasm; Do not drive, operate machinery, or drink alcohol with this medication as it can cause drowsiness.   Follow up with your primary care provider or an orthopedist if your symptoms are not improving.     

## 2021-05-01 DIAGNOSIS — M436 Torticollis: Secondary | ICD-10-CM | POA: Diagnosis not present

## 2021-05-01 DIAGNOSIS — M9903 Segmental and somatic dysfunction of lumbar region: Secondary | ICD-10-CM | POA: Diagnosis not present

## 2021-05-01 DIAGNOSIS — M9901 Segmental and somatic dysfunction of cervical region: Secondary | ICD-10-CM | POA: Diagnosis not present

## 2021-05-01 DIAGNOSIS — M5386 Other specified dorsopathies, lumbar region: Secondary | ICD-10-CM | POA: Diagnosis not present

## 2021-05-02 DIAGNOSIS — M9903 Segmental and somatic dysfunction of lumbar region: Secondary | ICD-10-CM | POA: Diagnosis not present

## 2021-05-02 DIAGNOSIS — M5386 Other specified dorsopathies, lumbar region: Secondary | ICD-10-CM | POA: Diagnosis not present

## 2021-05-02 DIAGNOSIS — M9901 Segmental and somatic dysfunction of cervical region: Secondary | ICD-10-CM | POA: Diagnosis not present

## 2021-05-02 DIAGNOSIS — M436 Torticollis: Secondary | ICD-10-CM | POA: Diagnosis not present

## 2021-05-03 DIAGNOSIS — M9901 Segmental and somatic dysfunction of cervical region: Secondary | ICD-10-CM | POA: Diagnosis not present

## 2021-05-03 DIAGNOSIS — M436 Torticollis: Secondary | ICD-10-CM | POA: Diagnosis not present

## 2021-05-03 DIAGNOSIS — M5386 Other specified dorsopathies, lumbar region: Secondary | ICD-10-CM | POA: Diagnosis not present

## 2021-05-03 DIAGNOSIS — M9903 Segmental and somatic dysfunction of lumbar region: Secondary | ICD-10-CM | POA: Diagnosis not present

## 2021-05-14 ENCOUNTER — Encounter: Payer: Self-pay | Admitting: Radiology

## 2021-05-21 DIAGNOSIS — M436 Torticollis: Secondary | ICD-10-CM | POA: Diagnosis not present

## 2021-05-21 DIAGNOSIS — M9901 Segmental and somatic dysfunction of cervical region: Secondary | ICD-10-CM | POA: Diagnosis not present

## 2021-05-21 DIAGNOSIS — M9903 Segmental and somatic dysfunction of lumbar region: Secondary | ICD-10-CM | POA: Diagnosis not present

## 2021-05-21 DIAGNOSIS — M5386 Other specified dorsopathies, lumbar region: Secondary | ICD-10-CM | POA: Diagnosis not present

## 2021-05-30 DIAGNOSIS — Z131 Encounter for screening for diabetes mellitus: Secondary | ICD-10-CM | POA: Diagnosis not present

## 2021-05-30 DIAGNOSIS — Z0000 Encounter for general adult medical examination without abnormal findings: Secondary | ICD-10-CM | POA: Diagnosis not present

## 2021-05-30 DIAGNOSIS — R0602 Shortness of breath: Secondary | ICD-10-CM | POA: Diagnosis not present

## 2021-05-30 DIAGNOSIS — Z3200 Encounter for pregnancy test, result unknown: Secondary | ICD-10-CM | POA: Diagnosis not present

## 2021-05-30 DIAGNOSIS — Z20822 Contact with and (suspected) exposure to covid-19: Secondary | ICD-10-CM | POA: Diagnosis not present

## 2021-05-30 DIAGNOSIS — Z1159 Encounter for screening for other viral diseases: Secondary | ICD-10-CM | POA: Diagnosis not present

## 2021-05-30 DIAGNOSIS — R5383 Other fatigue: Secondary | ICD-10-CM | POA: Diagnosis not present

## 2021-05-30 DIAGNOSIS — Z6829 Body mass index (BMI) 29.0-29.9, adult: Secondary | ICD-10-CM | POA: Diagnosis not present

## 2021-05-30 DIAGNOSIS — Z1339 Encounter for screening examination for other mental health and behavioral disorders: Secondary | ICD-10-CM | POA: Diagnosis not present

## 2021-05-30 DIAGNOSIS — E559 Vitamin D deficiency, unspecified: Secondary | ICD-10-CM | POA: Diagnosis not present

## 2021-06-07 DIAGNOSIS — E559 Vitamin D deficiency, unspecified: Secondary | ICD-10-CM | POA: Diagnosis not present

## 2021-06-07 DIAGNOSIS — E785 Hyperlipidemia, unspecified: Secondary | ICD-10-CM | POA: Diagnosis not present

## 2021-06-07 DIAGNOSIS — R7303 Prediabetes: Secondary | ICD-10-CM | POA: Diagnosis not present

## 2021-10-03 DIAGNOSIS — L700 Acne vulgaris: Secondary | ICD-10-CM | POA: Diagnosis not present

## 2021-11-08 DIAGNOSIS — R102 Pelvic and perineal pain: Secondary | ICD-10-CM | POA: Diagnosis not present

## 2022-01-22 DIAGNOSIS — H5053 Vertical heterophoria: Secondary | ICD-10-CM | POA: Diagnosis not present

## 2022-01-22 DIAGNOSIS — H50332 Intermittent monocular exotropia, left eye: Secondary | ICD-10-CM | POA: Diagnosis not present

## 2022-03-11 DIAGNOSIS — Z9889 Other specified postprocedural states: Secondary | ICD-10-CM | POA: Diagnosis not present

## 2022-03-11 DIAGNOSIS — H50332 Intermittent monocular exotropia, left eye: Secondary | ICD-10-CM | POA: Diagnosis not present

## 2022-03-21 ENCOUNTER — Encounter (HOSPITAL_BASED_OUTPATIENT_CLINIC_OR_DEPARTMENT_OTHER): Payer: Self-pay

## 2022-03-21 ENCOUNTER — Ambulatory Visit (HOSPITAL_BASED_OUTPATIENT_CLINIC_OR_DEPARTMENT_OTHER): Admit: 2022-03-21 | Payer: BLUE CROSS/BLUE SHIELD | Admitting: Ophthalmology

## 2022-03-21 SURGERY — STRABISMUS SURGERY, BILATERAL
Anesthesia: General | Laterality: Bilateral
